# Patient Record
Sex: Female | Born: 1945 | Race: White | Hispanic: No | Marital: Married | Smoking: Never smoker
Health system: Southern US, Community
[De-identification: ages and names within clinical notes are randomized; demographics above are authoritative.]

## PROBLEM LIST (undated history)

## (undated) DIAGNOSIS — J309 Allergic rhinitis, unspecified: Secondary | ICD-10-CM

## (undated) DIAGNOSIS — E039 Hypothyroidism, unspecified: Secondary | ICD-10-CM

## (undated) DIAGNOSIS — I8390 Asymptomatic varicose veins of unspecified lower extremity: Secondary | ICD-10-CM

## (undated) DIAGNOSIS — H269 Unspecified cataract: Secondary | ICD-10-CM

## (undated) DIAGNOSIS — I10 Essential (primary) hypertension: Secondary | ICD-10-CM

## (undated) DIAGNOSIS — M25461 Effusion, right knee: Secondary | ICD-10-CM

## (undated) DIAGNOSIS — E785 Hyperlipidemia, unspecified: Secondary | ICD-10-CM

## (undated) DIAGNOSIS — C801 Malignant (primary) neoplasm, unspecified: Secondary | ICD-10-CM

## (undated) DIAGNOSIS — C44311 Basal cell carcinoma of skin of nose: Secondary | ICD-10-CM

## (undated) DIAGNOSIS — C50919 Malignant neoplasm of unspecified site of unspecified female breast: Secondary | ICD-10-CM

## (undated) DIAGNOSIS — M1990 Unspecified osteoarthritis, unspecified site: Secondary | ICD-10-CM

## (undated) HISTORY — DX: Hypothyroidism, unspecified: E03.9

## (undated) HISTORY — DX: Asymptomatic varicose veins of unspecified lower extremity: I83.90

## (undated) HISTORY — DX: Malignant neoplasm of unspecified site of unspecified female breast: C50.919

## (undated) HISTORY — DX: Effusion, right knee: M25.461

## (undated) HISTORY — PX: TUBAL LIGATION: SHX77

## (undated) HISTORY — DX: Unspecified cataract: H26.9

## (undated) HISTORY — DX: Unspecified osteoarthritis, unspecified site: M19.90

## (undated) HISTORY — DX: Essential (primary) hypertension: I10

## (undated) HISTORY — DX: Allergic rhinitis, unspecified: J30.9

## (undated) HISTORY — DX: Hyperlipidemia, unspecified: E78.5

## (undated) HISTORY — DX: Basal cell carcinoma of skin of nose: C44.311

## (undated) HISTORY — DX: Malignant (primary) neoplasm, unspecified: C80.1

---

## 1982-06-17 HISTORY — PX: TUBAL LIGATION: SHX77

## 1993-06-17 DIAGNOSIS — C50919 Malignant neoplasm of unspecified site of unspecified female breast: Secondary | ICD-10-CM | POA: Insufficient documentation

## 1994-06-17 HISTORY — PX: BREAST LUMPECTOMY: SHX2

## 1998-07-19 ENCOUNTER — Ambulatory Visit (HOSPITAL_COMMUNITY): Admission: RE | Admit: 1998-07-19 | Discharge: 1998-07-19 | Payer: Self-pay | Admitting: Endocrinology

## 1998-07-19 ENCOUNTER — Encounter: Payer: Self-pay | Admitting: Endocrinology

## 1998-11-09 ENCOUNTER — Encounter: Payer: Self-pay | Admitting: Emergency Medicine

## 1998-11-09 ENCOUNTER — Emergency Department (HOSPITAL_COMMUNITY): Admission: EM | Admit: 1998-11-09 | Discharge: 1998-11-09 | Payer: Self-pay | Admitting: Emergency Medicine

## 1999-03-12 ENCOUNTER — Other Ambulatory Visit: Admission: RE | Admit: 1999-03-12 | Discharge: 1999-03-12 | Payer: Self-pay | Admitting: Gynecology

## 1999-04-10 ENCOUNTER — Ambulatory Visit (HOSPITAL_COMMUNITY): Admission: RE | Admit: 1999-04-10 | Discharge: 1999-04-10 | Payer: Self-pay | Admitting: Gynecology

## 1999-04-10 ENCOUNTER — Encounter (INDEPENDENT_AMBULATORY_CARE_PROVIDER_SITE_OTHER): Payer: Self-pay | Admitting: Specialist

## 1999-07-16 ENCOUNTER — Encounter: Payer: Self-pay | Admitting: Endocrinology

## 1999-07-16 ENCOUNTER — Ambulatory Visit (HOSPITAL_COMMUNITY): Admission: RE | Admit: 1999-07-16 | Discharge: 1999-07-16 | Payer: Self-pay | Admitting: Endocrinology

## 2000-03-31 ENCOUNTER — Encounter: Admission: RE | Admit: 2000-03-31 | Discharge: 2000-03-31 | Payer: Self-pay | Admitting: Endocrinology

## 2000-03-31 ENCOUNTER — Encounter: Payer: Self-pay | Admitting: Endocrinology

## 2001-04-13 ENCOUNTER — Other Ambulatory Visit: Admission: RE | Admit: 2001-04-13 | Discharge: 2001-04-13 | Payer: Self-pay | Admitting: Gynecology

## 2001-07-22 ENCOUNTER — Ambulatory Visit (HOSPITAL_COMMUNITY): Admission: RE | Admit: 2001-07-22 | Discharge: 2001-07-22 | Payer: Self-pay | Admitting: Gynecology

## 2001-07-22 ENCOUNTER — Encounter (INDEPENDENT_AMBULATORY_CARE_PROVIDER_SITE_OTHER): Payer: Self-pay | Admitting: Specialist

## 2003-05-09 ENCOUNTER — Ambulatory Visit (HOSPITAL_COMMUNITY): Admission: RE | Admit: 2003-05-09 | Discharge: 2003-05-09 | Payer: Self-pay | Admitting: Endocrinology

## 2003-05-26 ENCOUNTER — Other Ambulatory Visit: Admission: RE | Admit: 2003-05-26 | Discharge: 2003-05-26 | Payer: Self-pay | Admitting: Endocrinology

## 2003-07-19 ENCOUNTER — Other Ambulatory Visit: Admission: RE | Admit: 2003-07-19 | Discharge: 2003-07-19 | Payer: Self-pay | Admitting: Gynecology

## 2004-01-02 ENCOUNTER — Ambulatory Visit (HOSPITAL_COMMUNITY): Admission: RE | Admit: 2004-01-02 | Discharge: 2004-01-02 | Payer: Self-pay | Admitting: Endocrinology

## 2004-09-13 ENCOUNTER — Other Ambulatory Visit: Admission: RE | Admit: 2004-09-13 | Discharge: 2004-09-13 | Payer: Self-pay | Admitting: Gynecology

## 2004-12-05 ENCOUNTER — Ambulatory Visit (HOSPITAL_COMMUNITY): Admission: RE | Admit: 2004-12-05 | Discharge: 2004-12-05 | Payer: Self-pay | Admitting: Endocrinology

## 2005-08-12 ENCOUNTER — Ambulatory Visit (HOSPITAL_COMMUNITY): Admission: RE | Admit: 2005-08-12 | Discharge: 2005-08-12 | Payer: Self-pay | Admitting: Endocrinology

## 2006-06-20 ENCOUNTER — Ambulatory Visit: Payer: Self-pay | Admitting: Oncology

## 2006-07-09 ENCOUNTER — Encounter: Admission: RE | Admit: 2006-07-09 | Discharge: 2006-07-09 | Payer: Self-pay | Admitting: Endocrinology

## 2006-07-28 ENCOUNTER — Ambulatory Visit: Payer: Self-pay | Admitting: Oncology

## 2006-08-13 ENCOUNTER — Ambulatory Visit (HOSPITAL_COMMUNITY): Admission: RE | Admit: 2006-08-13 | Discharge: 2006-08-13 | Payer: Self-pay | Admitting: Endocrinology

## 2007-08-31 ENCOUNTER — Ambulatory Visit (HOSPITAL_COMMUNITY): Admission: RE | Admit: 2007-08-31 | Discharge: 2007-08-31 | Payer: Self-pay | Admitting: Oncology

## 2008-08-23 ENCOUNTER — Ambulatory Visit (HOSPITAL_COMMUNITY): Admission: RE | Admit: 2008-08-23 | Discharge: 2008-08-23 | Payer: Self-pay | Admitting: Internal Medicine

## 2008-12-08 ENCOUNTER — Encounter (INDEPENDENT_AMBULATORY_CARE_PROVIDER_SITE_OTHER): Payer: Self-pay | Admitting: *Deleted

## 2009-07-03 ENCOUNTER — Telehealth: Payer: Self-pay | Admitting: Internal Medicine

## 2009-07-19 ENCOUNTER — Encounter (INDEPENDENT_AMBULATORY_CARE_PROVIDER_SITE_OTHER): Payer: Self-pay | Admitting: *Deleted

## 2009-08-25 ENCOUNTER — Encounter (INDEPENDENT_AMBULATORY_CARE_PROVIDER_SITE_OTHER): Payer: Self-pay | Admitting: *Deleted

## 2009-08-28 ENCOUNTER — Ambulatory Visit: Payer: Self-pay | Admitting: Internal Medicine

## 2009-08-28 ENCOUNTER — Ambulatory Visit (HOSPITAL_COMMUNITY): Admission: RE | Admit: 2009-08-28 | Discharge: 2009-08-28 | Payer: Self-pay | Admitting: Endocrinology

## 2009-09-19 ENCOUNTER — Ambulatory Visit: Payer: Self-pay | Admitting: Internal Medicine

## 2010-03-20 ENCOUNTER — Ambulatory Visit (HOSPITAL_COMMUNITY): Admission: RE | Admit: 2010-03-20 | Discharge: 2010-03-20 | Payer: Self-pay | Admitting: Endocrinology

## 2010-04-18 ENCOUNTER — Encounter: Admission: RE | Admit: 2010-04-18 | Discharge: 2010-04-18 | Payer: Self-pay | Admitting: Endocrinology

## 2010-04-18 ENCOUNTER — Other Ambulatory Visit: Admission: RE | Admit: 2010-04-18 | Discharge: 2010-04-18 | Payer: Self-pay | Admitting: Interventional Radiology

## 2010-07-08 ENCOUNTER — Encounter: Payer: Self-pay | Admitting: Endocrinology

## 2010-07-19 NOTE — Miscellaneous (Signed)
Summary: colon on 09/06/2009/lk  Clinical Lists Changes  Medications: Added new medication of MOVIPREP 100 GM  SOLR (PEG-KCL-NACL-NASULF-NA ASC-C) As directed - Signed Rx of MOVIPREP 100 GM  SOLR (PEG-KCL-NACL-NASULF-NA ASC-C) As directed;  #1 x 0;  Signed;  Entered by: Clide Cliff RN;  Authorized by: Hart Carwin MD;  Method used: Electron[facility] to [facility]  [phone] [address] [place], [place], [place]  [address], Ph: [phone] or [phone], Fax: [phone] Allergies: Added new allergy or adverse reaction of AMOXICILLIN Observations: Added new observation of NKA: F (08/28/2009 9:07)    Prescriptions: MOVIPREP 100 GM  SOLR (PEG-KCL-NACL-NASULF-NA ASC-C) As directed  #1 x 0   Entered by:   Clide Cliff RN   Authorized by:   Hart Carwin MD   Signed by:   Clide Cliff RN on 08/28/2009   Method used:   Electronically to        [facility]  [address] [phone]* (retail)       [address]       [place], [place]  [address]       Ph: [phone] or [phone]       Fax: [phone]   RxID:   [id number]

## 2010-07-19 NOTE — Letter (Signed)
Summary: Recall Colonoscopy Letter  [facility]  [address] [place], [place] [address]   Phone: [phone]  Fax: [phone]      December 08, 2008 MRN: [id number]   [name] [address] [place], [place]  [address]   Dear Ms. Ruis,   According to your medical record, it is time for you to schedule a Colonoscopy. The American Cancer Society recommends this procedure as a method to detect early colon cancer. Patients with a family history of colon cancer, or a personal history of colon polyps or inflammatory bowel disease are at increased risk.  This letter has beeen generated based on the recommendations made at the time of your procedure. If you feel that in your particular situation this may no longer apply, please contact our office.  Please call our office at [phone] to schedule this appointment or to update your records at your earliest convenience.  Thank you for cooperating with [place] to provide you with the very best care possible.   Sincerely,  Hedwig Morton. Juanda Chance, M.D.  [facility] Gastroenterology Division [phone]

## 2010-07-19 NOTE — Procedures (Signed)
Summary: Colonoscopy  Patient: Sandra Galloway Note: All result statuses are Final unless otherwise noted.  Tests: (1) Colonoscopy (COL)   COL Colonoscopy           DONE     [facility]     [address]     [place], [place]  [address]           COLONOSCOPY PROCEDURE REPORT           PATIENT:  Sandra Galloway, Sandra Galloway  MR#:  [id number]     BIRTHDATE:  1946/04/16, 63 yrs. old  GENDER:  female     ENDOSCOPIST:  Hedwig Morton. Juanda Chance, MD     REF. BY:  Adela Lank, M.D.     PROCEDURE DATE:  09/19/2009     PROCEDURE:  Colonoscopy [address]     ASA CLASS:  Class I     INDICATIONS:  Elevated Risk Screening mother with colon cancer age     36     prior colon 1999, 2005, no polyps     MEDICATIONS:   Versed 8 mg, Fentanyl 50 mcg           DESCRIPTION OF PROCEDURE:   After the risks benefits and     alternatives of the procedure were thoroughly explained, informed     consent was obtained.  Digital rectal exam was performed and     revealed no rectal masses.   The LB CF-[id number] endoscope     was introduced through the anus and advanced to the cecum, which     was identified by both the appendix and ileocecal valve, without     limitations.  The quality of the prep was good, using MoviPrep.     The instrument was then slowly withdrawn as the colon was fully     examined.     <<PROCEDUREIMAGES>>           FINDINGS:  Moderate diverticulosis was found in the sigmoid colon     (see image1 and image6).  This was otherwise a normal examination     of the colon (see image7, image5, image4, image3, and image2).     Retroflexed views in the rectum revealed no abnormalities.    The     scope was then withdrawn from the patient and the procedure     completed.           COMPLICATIONS:  None     ENDOSCOPIC IMPRESSION:     1) Moderate diverticulosis in the sigmoid colon     2) Otherwise normal examination     RECOMMENDATIONS:     1) high fiber diet     REPEAT EXAM:  In 5 year(s) for.        ______________________________     Hedwig Morton. Juanda Chance, MD           CC:           n.     eSIGNED:   Hedwig Morton. Velva Molinari at 09/19/2009 01:54 PM           Jake Shark, [id number]  Note: An exclamation mark (!) indicates a result that was not dispersed into the flowsheet. Document Creation Date: 09/19/2009 1:55 PM _______________________________________________________________________  (1) Order result status: Final Collection or observation date-time: 09/19/2009 13:48 Requested date-time:  Receipt date-time:  Reported date-time:  Referring Physician:   Ordering Physician: Lina Sar [id number]) Specimen Source:  Source: Launa Grill Order Number: [id number] Lab site:   Appended Document: Colonoscopy  Clinical Lists Changes  Observations: Added new observation of COLONNXTDUE: 09/2014 (09/19/2009 16:07)

## 2010-07-19 NOTE — Letter (Signed)
Summary: Previsit letter  [facility]  [address] [place], [place] [address]   Phone: [phone]  Fax: [phone]       07/19/2009 MRN: [id number]  JAELEAH SMYSER [address] [place], [place]  [address]  [place]  Dear Ms. Daphine Deutscher,  Welcome to the Gastroenterology Division at [facility].    You are scheduled to see a nurse for your pre-procedure visit on 08/28/2009 at 9:00am on the 3rd floor at [facility], [address]  We ask that you try to arrive at our office 15 minutes prior to your appointment time to allow for check-in.  Your nurse visit will consist of discussing your medical and surgical history, your immediate family medical history, and your medications.    Please bring a complete list of all your medications or, if you prefer, bring the medication bottles and we will list them.  We will need to be aware of both prescribed and over the counter drugs.  We will need to know exact dosage information as well.  If you are on blood thinners (Coumadin, Plavix, Aggrenox, Ticlid, etc.) please call our office today/prior to your appointment, as we need to consult with your physician about holding your medication.   Please be prepared to read and sign documents such as consent forms, a financial agreement, and acknowledgement forms.  If necessary, and with your consent, a friend or relative is welcome to sit-in on the nurse visit with you.  Please bring your insurance card so that we may make a copy of it.  If your insurance requires a referral to see a specialist, please bring your referral form from your primary care physician.  No co-pay is required for this nurse visit.     If you cannot keep your appointment, please call [phone] to cancel or reschedule prior to your appointment date.  This allows [place] the opportunity to schedule an appointment for another patient in need of care.    Thank you for choosing [facility] for your medical  needs.  We appreciate the opportunity to care for you.  Please visit [place] at our website  to learn more about our practice.                     Sincerely.                                                                                                                   The Gastroenterology Division

## 2010-07-19 NOTE — Letter (Signed)
Summary: [facility] Instructions  [facility]  [address] [place], [place] [address]   Phone: [phone]  Fax: [phone]       SAHARAH SHERROW    1945-10-25    MRN: [id number]       Procedure Day /Date:  Tuesday  09/19/09     Arrival Time:  12:30pm     Procedure Time: 1:30pm     Location of Procedure:                    _ x_  [facility] (4th Floor)   PREPARATION FOR COLONOSCOPY WITH MIRALAX  Starting 5 days prior to your procedure Friday 09/14/09 do not eat nuts, seeds, popcorn, corn, beans, peas,  salads, or any raw vegetables.  Do not take any fiber supplements (e.g. Metamucil, Citrucel, and Benefiber). ____________________________________________________________________________________________________   THE DAY BEFORE YOUR PROCEDURE         DATE: Monday   09/18/09  1   Drink clear liquids the entire day-NO SOLID FOOD  2   Do not drink anything colored red or purple.  Avoid juices with pulp.  No orange juice.  3   Drink at least 64 oz. (8 glasses) of fluid/clear liquids during the day to prevent dehydration and help the prep work efficiently.  CLEAR LIQUIDS INCLUDE: Water Jello Ice Popsicles Tea (sugar ok, no milk/cream) Powdered fruit flavored drinks Coffee (sugar ok, no milk/cream) Gatorade Juice: apple, white grape, white cranberry  Lemonade Clear bullion, consomm, broth Carbonated beverages (any kind) Strained chicken noodle soup Hard Candy  4   Mix the entire bottle of Miralax with 64 oz. of Gatorade/Powerade in the morning and put in the refrigerator to chill.  5   At 3:00 pm take 2 Dulcolax/Bisacodyl tablets.  6   At 4:30 pm take one Reglan/Metoclopramide tablet.  7  Starting at 5:00 pm drink one 8 oz glass of the Miralax mixture every 15-20 minutes until you have finished drinking the entire 64 oz.  You should finish drinking prep around 7:30 or 8:00 pm.  8   If you are nauseated, you may take the 2nd Reglan/Metoclopramide  tablet at 6:30 pm.        9    At 8:00 pm take 2 more DULCOLAX/Bisacodyl tablets.     THE DAY OF YOUR PROCEDURE      DATE:  Tuesday   09/19/09  You may drink clear liquids until  11:30am  (2 HOURS BEFORE PROCEDURE).   MEDICATION INSTRUCTIONS  Unless otherwise instructed, you should take regular prescription medications with a small sip of water as early as possible the morning of your procedure.         OTHER INSTRUCTIONS  You will need a responsible adult at least 64 years of age to accompany you and drive you home.   This person must remain in the waiting room during your procedure.  Wear loose fitting clothing that is easily removed.  Leave jewelry and other valuables at home.  However, you may wish to bring a book to read or an iPod/MP3 player to listen to music as you wait for your procedure to start.  Remove all body piercing jewelry and leave at home.  Total time from sign-in until discharge is approximately 2-3 hours.  You should go home directly after your procedure and rest.  You can resume normal activities the day after your procedure.  The day of your procedure you should not:   Drive  Make legal decisions   Operate machinery   Drink alcohol   Return to work  You will receive specific instructions about eating, activities and medications before you leave.   The above instructions have been reviewed and explained to me by   Clide Cliff, RN______________________    I fully understand and can verbalize these instructions _____________________________ Date _______

## 2010-07-19 NOTE — Progress Notes (Signed)
Summary: Schedule Colonoscopy  Phone Note Outgoing Call Call back at [facility] Phone [phone]   Call placed by: Harlow Mares CMA Duncan Dull),  July 03, 2009 11:08 AM Call placed to: Patient Summary of Call: Left message on patients machine to call back. patient needs colonoscopy Initial call taken by: Harlow Mares CMA Duncan Dull),  July 03, 2009 11:09 AM  Follow-up for Phone Call        No Answer  Follow-up by: Harlow Mares CMA Duncan Dull),  July 10, 2009 4:28 PM  Additional Follow-up for Phone Call Additional follow up Details #1::        patient scheduled for 09/06/2009. Additional Follow-up by: Harlow Mares CMA (AAMA),  July 19, 2009 11:54 AM

## 2010-09-19 ENCOUNTER — Other Ambulatory Visit (HOSPITAL_COMMUNITY): Payer: Self-pay | Admitting: Endocrinology

## 2010-09-19 DIAGNOSIS — E042 Nontoxic multinodular goiter: Secondary | ICD-10-CM

## 2010-11-02 NOTE — Op Note (Signed)
[facility]  Patient:    Sandra Galloway, Sandra Galloway Visit Number: [id number] MRN: [id number]          Service Type: DSU Location: [facility] Attending Physician:  Susa Raring Dictated by:   Luvenia Redden, M.D. Proc. Date: 07/22/01 Admit Date:  07/22/2001                             Operative Report  PREOPERATIVE DIAGNOSIS:       Postmenopausal bleeding.  POSTOPERATIVE DIAGNOSES:      1. Postmenopausal bleeding.                               2. Endometrial polyps.  OPERATION:                    Hysteroscopy and dilatation and curettage.  SURGEON:                      Luvenia Redden, M.D.  DESCRIPTION OF PROCEDURE:     Under sedation, the patient was prepped and draped in a sterile manner.  The bladder was catheterized.  The speculum was inserted into the vagina and the cervix grasped with the tenaculum.  A paracervical block was done by injecting 10 cc of lidocaine at the 4 and 8 positions paracervically.  The uterus then sounded to 9 cm and the cervix was dilated.  The hysteroscope was placed into the endocervical canal.  Using sorbitol as the distending medium, hysteroscopy was performed.  At the upper end of the endocervical canal, immediately a polypoid structure was visualized, was rather large, and pretty much obscured the view as the scope was advanced in the endometrial cavity.  This was felt to be due to multiple large intracavitary polyps.  The scope was retracted, and the endometrial cavity was then explored with polyp forceps, and a large amount of polypoid tissue was then extracted.  This required multiple explorations and bites with the polyp forceps.  Eventually, the cavity became clear and clean, and exploration of the cavity revealed no more polypoid material could be grasped. The endocervical curettage was done and this was sent as a separate specimen. Endometrial cavity curettage was done and this was sent with some  polypoid fragments.  The scope was then repositioned and the intracavitary portion of the uterus was then viewed.  Other than the very shaggy appearance to the endometrial cavity, no further polyps could be identified.  The scope was retracted.  The cavity was wiped with a dry sponge.  No additional tissue was obtained.  The procedure was then terminated.  The fluid deficit was 10 ml.  Blood loss was 25 ml or less.  The patient tolerated the procedure well and was removed to recovery in good condition. Dictated by:   Luvenia Redden, M.D. Attending Physician:  Susa Raring DD:  07/22/01 TD:  07/23/01 Job: [id number]

## 2011-03-11 ENCOUNTER — Other Ambulatory Visit (HOSPITAL_COMMUNITY): Payer: Self-pay

## 2011-03-20 ENCOUNTER — Ambulatory Visit (HOSPITAL_COMMUNITY)
Admission: RE | Admit: 2011-03-20 | Discharge: 2011-03-20 | Disposition: A | Discharge status: Home / Self Care | Payer: 59 | Source: Ambulatory Visit | Attending: Endocrinology | Admitting: Endocrinology

## 2011-03-20 DIAGNOSIS — E042 Nontoxic multinodular goiter: Secondary | ICD-10-CM

## 2012-06-29 ENCOUNTER — Other Ambulatory Visit (HOSPITAL_COMMUNITY): Payer: Self-pay | Admitting: Endocrinology

## 2012-06-29 DIAGNOSIS — E041 Nontoxic single thyroid nodule: Secondary | ICD-10-CM

## 2012-07-02 ENCOUNTER — Ambulatory Visit (HOSPITAL_COMMUNITY)
Admission: RE | Admit: 2012-07-02 | Discharge: 2012-07-02 | Disposition: A | Discharge status: Home / Self Care | Payer: Medicare Other | Source: Ambulatory Visit | Attending: Endocrinology | Admitting: Endocrinology

## 2012-07-02 DIAGNOSIS — E042 Nontoxic multinodular goiter: Secondary | ICD-10-CM | POA: Insufficient documentation

## 2012-07-02 DIAGNOSIS — E041 Nontoxic single thyroid nodule: Secondary | ICD-10-CM

## 2012-07-10 ENCOUNTER — Other Ambulatory Visit: Payer: Self-pay | Admitting: Endocrinology

## 2012-07-10 DIAGNOSIS — E041 Nontoxic single thyroid nodule: Secondary | ICD-10-CM

## 2012-07-21 ENCOUNTER — Other Ambulatory Visit (HOSPITAL_COMMUNITY)
Admission: RE | Admit: 2012-07-21 | Discharge: 2012-07-21 | Disposition: A | Discharge status: Home / Self Care | Payer: Medicare Other | Source: Ambulatory Visit | Attending: Diagnostic Radiology | Admitting: Diagnostic Radiology

## 2012-07-21 ENCOUNTER — Ambulatory Visit
Admission: RE | Admit: 2012-07-21 | Discharge: 2012-07-21 | Disposition: A | Discharge status: Home / Self Care | Payer: Medicare Other | Source: Ambulatory Visit | Attending: Endocrinology | Admitting: Endocrinology

## 2012-07-21 DIAGNOSIS — E049 Nontoxic goiter, unspecified: Secondary | ICD-10-CM | POA: Insufficient documentation

## 2012-07-21 DIAGNOSIS — E041 Nontoxic single thyroid nodule: Secondary | ICD-10-CM

## 2012-08-06 ENCOUNTER — Encounter (INDEPENDENT_AMBULATORY_CARE_PROVIDER_SITE_OTHER): Payer: Self-pay | Admitting: Surgery

## 2012-08-10 ENCOUNTER — Ambulatory Visit (INDEPENDENT_AMBULATORY_CARE_PROVIDER_SITE_OTHER): Payer: Medicare Other | Admitting: Surgery

## 2012-08-10 ENCOUNTER — Encounter (INDEPENDENT_AMBULATORY_CARE_PROVIDER_SITE_OTHER): Payer: Self-pay | Admitting: Surgery

## 2012-08-10 VITALS — BP 136/96 | HR 76 | Temp 98.0°F | Resp 14 | Ht 64.0 in | Wt 164.4 lb

## 2012-08-10 DIAGNOSIS — E041 Nontoxic single thyroid nodule: Secondary | ICD-10-CM

## 2012-08-10 NOTE — Progress Notes (Signed)
General Surgery [facility]  Chief Complaint  Patient presents with  . Thyroid Nodule    Evaluate thyroid nodule - referral from Dr. Juleen China, [facility]    HISTORY: Patient is a 67 year old white female followed for well over 10 years with thyroid nodules. She has a dominant nodule in the thyroid isthmus which has ranged in size from 2.7-3.3 cm by ultrasound. She underwent a followup ultrasound in January 2014 which measured the nodule at 2.9 cm, represented a small increase in size compared to her study in 2008. 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 There are small bilateral nodules with the largest on the left measuring 1.3 cm in the largest on the right measuring 6 mm. Patient has been on thyroid hormone suppression for many years. She currently takes Synthroid 137 mcg daily. She is followed closely by her endocrinologist.  The patient denies any compressive symptoms. She has had no prior neck surgery. She denies any family history of thyroid disease. There is no history of other endocrinopathy.  Past Medical History  Diagnosis Date  . Cancer     right breast     Current Outpatient Prescriptions  Medication Sig Dispense Refill  . levothyroxine (SYNTHROID, LEVOTHROID) 137 MCG tablet Take 137 mcg by mouth daily.      . pravastatin (PRAVACHOL) 80 MG tablet Take 80 mg by mouth daily.       No current facility-administered medications for this visit.     Allergies  Allergen Reactions  . Amoxicillin     REACTION: rash     Family History  Problem Relation Age of Onset  . Cancer Sister     breast     History   Social History  . Marital Status: Married    Spouse Name: N/A    Number of Children: N/A  . Years of Education: N/A   Social History Main Topics  . Smoking status: Never Smoker   . Smokeless tobacco: None  . Alcohol Use: No  . Drug Use: No  . Sexually Active:  None   Other Topics Concern  . None   Social History Narrative  . None     REVIEW OF SYSTEMS - PERTINENT POSITIVES ONLY: No compressive symptoms. Denies tremor. Denies palpitations. Denies new neck masses.  EXAM: Filed Vitals:   08/10/12 0901  BP: 136/96  Pulse: 76  Temp: 98 F (36.7 C)  Resp: 14    HEENT: normocephalic; pupils equal and reactive; sclerae clear; dentition good; mucous membranes moist NECK:  Palpable nodule in the right anterior isthmus, soft, mobile, nontender, approximately 3 cm in size; asymmetric on extension; no palpable anterior or posterior cervical lymphadenopathy; no supraclavicular masses; no tenderness CHEST: clear to auscultation bilaterally without rales, rhonchi, or wheezes CARDIAC: regular rate and rhythm without significant murmur; peripheral pulses are full EXT:  non-tender without edema; no deformity NEURO: no gross focal deficits; no sign of tremor   LABORATORY RESULTS: See [facility] (CHL-Epic) for most recent results   RADIOLOGY RESULTS: See [facility] (CHL-Epic) for most recent results   IMPRESSION: Multinodular thyroid, dominant nodule in the isthmus, 2.9 cm, clinically stable  PLAN: The patient and I had a lengthy discussion regarding these findings. Over the past several years this lesion has waxed and waned in size, likely due to thyroid hormone suppression. It has not significantly enlarged. She has had no compressive symptoms. There are no worrisome findings on multiple percutaneous biopsies.  I provided the patient with written literature to review. We discussed thyroid surgery. At this point the patient would like to continue observation. I will last her to return for physical examination in one year. We will plan to repeat her thyroid ultrasound in 2 years. I would recommend that she not undergo any further percutaneous biopsies of the dominant nodule. Certainly if there was a significant change in size or  character, we would recommend proceeding with thyroidectomy.  Patient understands and will return in 1 year for follow-up.  Velora Heckler, MD, FACS General & Endocrine Surgery [facility]   Visit Diagnoses: 1. Thyroid nodule, uninodular

## 2012-08-10 NOTE — Patient Instructions (Signed)
Thyroid Diseases Your thyroid is a butterfly-shaped gland in your neck. It is located just above your collarbone. It is one of your endocrine glands, which make hormones. The thyroid helps set your metabolism. Metabolism is how your body gets energy from the foods you eat.  Millions of people have thyroid diseases. Women experience thyroid problems more often than men. In fact, overactive thyroid problems (hyperthyroidism) occur in 1% of all women. If you have a thyroid disease, your body may use energy more slowly or quickly than it should.  Thyroid problems also include an immune disease where your body reacts against your thyroid gland (called thyroiditis). A different problem involves lumps and bumps (called nodules) that develop in the gland. The nodules are usually, but not always, noncancerous. THE MOST COMMON THYROID PROBLEMS AND CAUSES ARE DISCUSSED BELOW There are many causes for thyroid problems. Treatment depends upon the exact diagnosis and includes trying to reset your body's metabolism to a normal rate. Hyperthyroidism Too much thyroid hormone from an overactive thyroid gland is called hyperthyroidism. In hyperthyroidism, the body's metabolism speeds up. One of the most frequent forms of hyperthyroidism is known as Graves' disease. Graves' disease tends to run in families. Although Graves' is thought to be caused by a problem with the immune system, the exact nature of the genetic problem is unknown. Hypothyroidism Too little thyroid hormone from an underactive thyroid gland is called hypothyroidism. In hypothyroidism, the body's metabolism is slowed. Several things can cause this condition. Most causes affect the thyroid gland directly and hurt its ability to make enough hormone.  Rarely, there may be a pituitary gland tumor (located near the base of the brain). The tumor can block the pituitary from producing thyroid-stimulating hormone (TSH). Your body makes TSH to stimulate the thyroid  to work properly. If the pituitary does not make enough TSH, the thyroid fails to make enough hormones needed for good health. Whether the problem is caused by thyroid conditions or by the pituitary gland, the result is that the thyroid is not making enough hormones. Hypothyroidism causes many physical and mental processes to become sluggish. The body consumes less oxygen and produces less body heat. Thyroid Nodules A thyroid nodule is a small swelling or lump in the thyroid gland. They are common. These nodules represent either a growth of thyroid tissue or a fluid-filled cyst. Both form a lump in the thyroid gland. Almost half of all people will have tiny thyroid nodules at some point in their lives. Typically, these are not noticeable until they become large and affect normal thyroid size. Larger nodules that are greater than a half inch across (about 1 centimeter) occur in about 5 percent of people. Although most nodules are not cancerous, people who have them should seek medical care to rule out cancer. Also, some thyroid nodules may produce too much thyroid hormone or become too large. Large nodules or a large gland can interfere with breathing or swallowing or may cause neck discomfort. Other problems Other thyroid problems include cancer and thyroiditis. Thyroiditis is a malfunction of the body's immune system. Normally, the immune system works to defend the body against infection and other problems. When the immune system is not working properly, it may mistakenly attack normal cells, tissues, and organs. Examples of autoimmune diseases are Hashimoto's thyroiditis (which causes low thyroid function) and Graves' disease (which causes excess thyroid function). SYMPTOMS  Symptoms vary greatly depending upon the exact type of problem with the thyroid. Hyperthyroidism-is when your thyroid is too   active and makes more thyroid hormone than your body needs. The most common cause is Graves' Disease. Too  much thyroid hormone can cause some or all of the following symptoms:  Anxiety.  Irritability.  Difficulty sleeping.  Fatigue.  A rapid or irregular heartbeat.  A fine tremor of your hands or fingers.  An increase in perspiration.  Sensitivity to heat.  Weight loss, despite normal food intake.  Brittle hair.  Enlargement of your thyroid gland (goiter).  Light menstrual periods.  Frequent bowel movements. Graves' disease can specifically cause eye and skin problems. The skin problems involve reddening and swelling of the skin, often on your shins and on the top of your feet. Eye problems can include the following:  Excess tearing and sensation of grit or sand in either or both eyes.  Reddened or inflamed eyes.  Widening of the space between your eyelids.  Swelling of the lids and tissues around the eyes.  Light sensitivity.  Ulcers on the cornea.  Double vision.  Limited eye movements.  Blurred or reduced vision. Hypothyroidism- is when your thyroid gland is not active enough. This is more common than hyperthyroidism. Symptoms can vary a lot depending of the severity of the hormone deficiency. Symptoms may develop over a long period of time and can include several of the following:  Fatigue.  Sluggishness.  Increased sensitivity to cold.  Constipation.  Pale, dry skin.  A puffy face.  Hoarse voice.  High blood cholesterol level.  Unexplained weight gain.  Muscle aches, tenderness and stiffness.  Pain, stiffness or swelling in your joints.  Muscle weakness.  Heavier than normal menstrual periods.  Brittle fingernails and hair.  Depression. Thyroid Nodules - most do not cause signs or symptoms. Occasionally, some may become so large that you can feel or even see the swelling at the base of your neck. You may realize a lump or swelling is there when you are shaving or putting on makeup. Men might become aware of a nodule when shirt collars  suddenly feel too tight. Some nodules produce too much thyroid hormone. This can produce the same symptoms as hyperthyroidism (see above). Thyroid nodules are seldom cancerous. However, a nodule is more likely to be malignant (cancerous) if it:  Grows quickly or feels hard.  Causes you to become hoarse or to have trouble swallowing or breathing.  Causes enlarged lymph nodes under your jaw or in your neck. DIAGNOSIS  Because there are so many possible thyroid conditions, your caregiver may ask for a number of tests. They will do this in order to narrow down the exact diagnosis. These tests can include:  Blood and antibody tests.  Special thyroid scans using small, safe amounts of radioactive iodine.  Ultrasound of the thyroid gland (particularly if there is a nodule or lump).  Biopsy. This is usually done with a special needle. A needle biopsy is a procedure to obtain a sample of cells from the thyroid. The tissue will be tested in a lab and examined under a microscope. TREATMENT  Treatment depends on the exact diagnosis. Hyperthyroidism  Beta-blockers help relieve many of the symptoms.  Anti-thyroid medications prevent the thyroid from making excess hormones.  Radioactive iodine treatment can destroy overactive thyroid cells. The iodine can permanently decrease the amount of hormone produced.  Surgery to remove the thyroid gland.  Treatments for eye problems that come from Graves' disease also include medications and special eye surgery, if felt to be appropriate. Hypothyroidism Thyroid replacement with levothyroxine is   the mainstay of treatment. Treatment with thyroid replacement is usually lifelong and will require monitoring and adjustment from time to time. Thyroid Nodules  Watchful waiting. If a small nodule causes no symptoms or signs of cancer on biopsy, then no treatment may be chosen at first. Re-exam and re-checking blood tests would be the recommended  follow-up.  Anti-thyroid medications or radioactive iodine treatment may be recommended if the nodules produce too much thyroid hormone (see Treatment for Hyperthyroidism above).  Alcohol ablation. Injections of small amounts of ethyl alcohol (ethanol) can cause a non-cancerous nodule to shrink in size.  Surgery (see Treatment for Hyperthyroidism above). HOME CARE INSTRUCTIONS   Take medications as instructed.  Follow through on recommended testing. SEEK MEDICAL CARE IF:   You feel that you are developing symptoms of Hyperthyroidism or Hypothyroidism as described above.  You develop a new lump/nodule in the neck/thyroid area that you had not noticed before.  You feel that you are having side effects from medicines prescribed.  You develop trouble breathing or swallowing. SEEK IMMEDIATE MEDICAL CARE IF:   You develop a fever of 102 F (38.9 C) or higher.  You develop severe sweating.  You develop palpitations and/or rapid heart beat.  You develop shortness of breath.  You develop nausea and vomiting.  You develop extreme shakiness.  You develop agitation.  You develop lightheadedness or have a fainting episode. Document Released: 03/31/2007 Document Revised: 08/26/2011 Document Reviewed: 03/31/2007 [employer] Patient Information 2013 [employer].  

## 2012-09-28 ENCOUNTER — Encounter (INDEPENDENT_AMBULATORY_CARE_PROVIDER_SITE_OTHER): Payer: Self-pay

## 2012-11-11 ENCOUNTER — Other Ambulatory Visit: Payer: Self-pay | Admitting: *Deleted

## 2012-11-11 DIAGNOSIS — I83893 Varicose veins of bilateral lower extremities with other complications: Secondary | ICD-10-CM

## 2012-12-29 ENCOUNTER — Encounter: Payer: Medicare Other | Admitting: Vascular Surgery

## 2013-01-28 ENCOUNTER — Encounter: Payer: Self-pay | Admitting: Vascular Surgery

## 2013-01-29 ENCOUNTER — Encounter (INDEPENDENT_AMBULATORY_CARE_PROVIDER_SITE_OTHER): Payer: Medicare Other | Admitting: *Deleted

## 2013-01-29 ENCOUNTER — Encounter: Payer: Self-pay | Admitting: Vascular Surgery

## 2013-01-29 ENCOUNTER — Ambulatory Visit (INDEPENDENT_AMBULATORY_CARE_PROVIDER_SITE_OTHER): Payer: Medicare Other | Admitting: Vascular Surgery

## 2013-01-29 VITALS — BP 153/86 | HR 67 | Ht 64.0 in | Wt 160.9 lb

## 2013-01-29 DIAGNOSIS — I872 Venous insufficiency (chronic) (peripheral): Secondary | ICD-10-CM

## 2013-01-29 DIAGNOSIS — I83893 Varicose veins of bilateral lower extremities with other complications: Secondary | ICD-10-CM | POA: Insufficient documentation

## 2013-01-29 NOTE — Progress Notes (Signed)
[facility]  Referred by:  Darci Needle, MD [address] [place], [place] [address]  Reason for referral: B leg varicosities  History of Present Illness  Sandra Galloway is a 67 y.o. (March 20, 1946) female who presents with chief complaint: B leg varicosities.  Patient notes, onset of spider veins during her pregnancy years ago, but over last 6 months the patient notes increasing vein growth, associated with no obvious trigger.  The patient's symptoms include: fullness and heaviness in both legs.  The patient has had never history of DVT, known history of pregnancy, known history of varicose vein, no history of venous stasis ulcers, no history of  Lymphedema and known history of skin changes in lower legs.  There is known family history of venous disorders.  The patient has never used compression stockings in the past.  Past Medical History  Diagnosis Date  . Cancer     right breast    Past Surgical History  Procedure Laterality Date  . Breast lumpectomy  1996    right breast    History   Social History  . Marital Status: Married    Spouse Name: N/A    Number of Children: N/A  . Years of Education: N/A   Occupational History  . Not on file.   Social History Main Topics  . Smoking status: Never Smoker   . Smokeless tobacco: Never Used  . Alcohol Use: No  . Drug Use: No  . Sexual Activity: Not on file   Other Topics Concern  . Not on file   Social History Narrative  . No narrative on file    Family History  Problem Relation Age of Onset  . Cancer Sister     breast  . Cancer Mother   . Other Mother     varicose veins  . Heart disease Father   . Hyperlipidemia Father   . Heart attack Father     Current Outpatient Prescriptions on File Prior to Visit  Medication Sig Dispense Refill  . levothyroxine (SYNTHROID, LEVOTHROID) 137 MCG tablet Take 137 mcg by mouth daily.      . pravastatin (PRAVACHOL) 80 MG tablet Take 80 mg  by mouth daily.       No current facility-administered medications on file prior to visit.    Allergies  Allergen Reactions  . Amoxicillin     REACTION: rash    REVIEW OF SYSTEMS:  (Positives checked otherwise negative)  CARDIOVASCULAR:  []  chest pain, []  chest pressure, []  palpitations, []  shortness of breath when laying flat, []  shortness of breath with exertion,  []  pain in feet when walking, []  pain in feet when laying flat, []  history of blood clot in veins (DVT), []  history of phlebitis, []  swelling in legs, [x]  varicose veins  PULMONARY:  []  productive cough, []  asthma, []  wheezing  NEUROLOGIC:  []  weakness in arms or legs, []  numbness in arms or legs, []  difficulty speaking or slurred speech, []  temporary loss of vision in one eye, []  dizziness  HEMATOLOGIC:  []  bleeding problems, []  problems with blood clotting too easily  MUSCULOSKEL:  []  joint pain, []  joint swelling  GASTROINTEST:  []  vomiting blood, []  blood in stool     GENITOURINARY:  []  burning with urination, []  blood in urine  PSYCHIATRIC:  []  history of major depression  INTEGUMENTARY:  []  rashes, []  ulcers  CONSTITUTIONAL:  []  fever, []  chills  Physical Examination Filed Vitals:   01/29/13 0920  BP: 153/86  Pulse: 67  Height: 5\' 4"  (1.626 m)  Weight: 160 lb 14.4 oz (72.984 kg)  SpO2: 100%   Body mass index is 27.6 kg/(m^2).  General: A&O x 3, WDWN  Head: /AT  Ear/Nose/Throat: Hearing grossly intact, nares w/o erythema or drainage, oropharynx w/o Erythema/Exudate  Eyes: PERRLA, EOMI  Neck: Supple, no nuchal rigidity, no palpable LAD  Pulmonary: Sym exp, good air movt, CTAB, no rales, rhonchi, & wheezing  Cardiac: RRR, Nl S1, S2, no Murmurs, rubs or gallops  Vascular: Vessel Right Left  Radial Palpable Palpable  Brachial  Palpable  Palpable  Carotid Palpable, without bruit Palpable, without bruit  Aorta Not palpable N/A  Femoral Palpable Palpable  Popliteal Not palpable Not palpable   PT Faintly Palpable Faintly Palpable  DP Palpable Palpable   Gastrointestinal: soft, NTND, -G/R, - HSM, - masses, - CVAT B  Musculoskeletal: M/S 5/5 throughout , Extremities without ischemic changes , BLE with extensive spider veins, +mild LDS L>R, clusters of varicosities bilaterally  Neurologic: CN 2-12 intact , Pain and light touch intact in extremities , Motor exam as listed above  Psychiatric: Judgment intact, Mood & affect appropriate for pt's clinical situation  Dermatologic: See M/S exam for extremity exam, no rashes otherwise noted  Lymph : No Cervical, Axillary, or Inguinal lymphadenopathy   Non-Invasive Vascular Imaging  BLE Venous Insufficiency Duplex (Date: 01/29/2013):   RLE: no DVT and SVT, + GSV reflux, + deep venous reflux  LLE: no DVT and SVT, + GSV reflux, + deep venous reflux  Outside Studies/Documentation 4 pages of outside documents were reviewed including: outpatient clinic and labs.  Medical Decision Making  Kelce Bouton Daphine Deutscher is a 67 y.o. female who presents with: BLE chronic venous insufficiency (C4).   Based on the patient's history and examination, I recommend: compressive therapy for 3 months followe by evaluation for EVLA B GSV.  I discussed with the patient the use of her 20-30 mm thigh high compression stockings and need for 3 month trial of such.  The patient will follow up in 3 months with my partners in the [facility] for evaluation for: EVLA B GSV.  I discussed with the patient: from a cosmetic viewpoint, I suspect additional stab phlebectomy and sclerotherapy will needed after EVLA.  Thank you for allowing [place] to participate in this patient's care.  Leonides Sake, MD [facility] Office: [phone] Pager: [phone]  01/29/2013, 9:45 AM

## 2013-02-01 ENCOUNTER — Encounter: Payer: Self-pay | Admitting: Endocrinology

## 2013-04-22 ENCOUNTER — Other Ambulatory Visit: Payer: Self-pay

## 2013-05-04 ENCOUNTER — Ambulatory Visit: Payer: Medicare Other | Admitting: Vascular Surgery

## 2013-05-05 ENCOUNTER — Other Ambulatory Visit (HOSPITAL_COMMUNITY): Payer: Self-pay | Admitting: Endocrinology

## 2013-05-05 DIAGNOSIS — E041 Nontoxic single thyroid nodule: Secondary | ICD-10-CM

## 2013-05-07 ENCOUNTER — Ambulatory Visit (HOSPITAL_COMMUNITY): Payer: Medicare Other

## 2013-05-10 ENCOUNTER — Encounter: Payer: Self-pay | Admitting: Vascular Surgery

## 2013-05-11 ENCOUNTER — Ambulatory Visit (INDEPENDENT_AMBULATORY_CARE_PROVIDER_SITE_OTHER): Payer: Medicare Other | Admitting: Vascular Surgery

## 2013-05-11 ENCOUNTER — Encounter: Payer: Self-pay | Admitting: Vascular Surgery

## 2013-05-11 VITALS — BP 125/79 | HR 69 | Resp 16 | Ht 64.0 in | Wt 154.0 lb

## 2013-05-11 DIAGNOSIS — I83893 Varicose veins of bilateral lower extremities with other complications: Secondary | ICD-10-CM

## 2013-05-11 NOTE — Progress Notes (Signed)
Here today for continued followup and discussion of her lower surety venous hypertension. She has had had no response from her elevation thigh graduated compression and ibuprofen for discomfort. She is quite active and central prolonged period of time reports a very achy sensation in both legs most specifically from her knees distally. She also has some discomfort there were some very large telangiectasia most prominently over her pretibial region on the left and also a lateral thigh and calf on the right.  Past Medical History  Diagnosis Date  . Cancer     right breast  . Varicose veins     History  Substance Use Topics  . Smoking status: Never Smoker   . Smokeless tobacco: Never Used  . Alcohol Use: No    Family History  Problem Relation Age of Onset  . Cancer Sister     breast  . Cancer Mother   . Other Mother     varicose veins  . Heart disease Father   . Hyperlipidemia Father   . Heart attack Father     Allergies  Allergen Reactions  . Amoxicillin     REACTION: rash    Current outpatient prescriptions:levothyroxine (SYNTHROID, LEVOTHROID) 137 MCG tablet, Take 137 mcg by mouth daily., Disp: , Rfl: ;  pravastatin (PRAVACHOL) 80 MG tablet, Take 80 mg by mouth daily., Disp: , Rfl:   BP 125/79  Pulse 69  Resp 16  Ht 5\' 4"  (1.626 m)  Wt 154 lb (69.854 kg)  BMI 26.42 kg/m2  Body mass index is 26.42 kg/(m^2).       On physical exam well-developed well-nourished white female no acute distress Pulse status 2+ dorsalis pedis pulses bilaterally Marked telangiectasia bilaterally extending into her ankle with no open ulceration. Neurologically she is grossly intact  I did review her prior venous duplex and also reimage her saphenous veins with SonoSite in our office today. This shows enlarged and refluxing great saphenous vein bilaterally.  Impression and plan: Failed conservative treatment of bilateral venous hypertension. Have recommended laser ablation of her great  saphenous vein bilaterally for symptom relief. I explained this would be a stage procedure under local anesthesia. We also recommend sclerotherapy injection to the painful telangiectasia as well. She understands his under local anesthesia in our office. She is requesting oral anti-anxiety treatment at the time of the procedure. We will proceed this in his convenience for her schedule

## 2013-05-25 ENCOUNTER — Other Ambulatory Visit: Payer: Self-pay | Admitting: *Deleted

## 2013-05-25 ENCOUNTER — Telehealth: Payer: Self-pay | Admitting: Vascular Surgery

## 2013-05-25 DIAGNOSIS — I83893 Varicose veins of bilateral lower extremities with other complications: Secondary | ICD-10-CM

## 2013-05-25 DIAGNOSIS — M79609 Pain in unspecified limb: Secondary | ICD-10-CM

## 2013-05-25 NOTE — Telephone Encounter (Addendum)
Message copied by Fredrich Birks on Tue May 25, 2013  3:58 PM ------      Message from: Domenic Moras D      Created: Tue May 25, 2013  2:48 PM      Regarding: scheduling        Please schedule Mrs. Woerner for:            1.  Post laser ablation duplex (left leg, order in EPIC) and VV FU with Dr. Arbie Cookey on 07-15-2013.            2.  Post laser ablation duplex (right leg, order in EPIC) and VV FU with Dr. Arbie Cookey on 07-29-2013.              Thanks! ------  05/25/13: sent msg to sonya, dpm

## 2013-06-29 ENCOUNTER — Other Ambulatory Visit: Payer: Self-pay | Admitting: *Deleted

## 2013-06-29 DIAGNOSIS — I83893 Varicose veins of bilateral lower extremities with other complications: Secondary | ICD-10-CM

## 2013-06-30 ENCOUNTER — Other Ambulatory Visit: Payer: Self-pay | Admitting: *Deleted

## 2013-06-30 DIAGNOSIS — I83893 Varicose veins of bilateral lower extremities with other complications: Secondary | ICD-10-CM

## 2013-07-05 ENCOUNTER — Encounter (INDEPENDENT_AMBULATORY_CARE_PROVIDER_SITE_OTHER): Payer: Self-pay | Admitting: Surgery

## 2013-07-08 ENCOUNTER — Other Ambulatory Visit: Payer: Medicare Other | Admitting: Vascular Surgery

## 2013-07-14 ENCOUNTER — Other Ambulatory Visit: Payer: Self-pay | Admitting: *Deleted

## 2013-07-14 DIAGNOSIS — M79609 Pain in unspecified limb: Secondary | ICD-10-CM

## 2013-07-14 DIAGNOSIS — I83893 Varicose veins of bilateral lower extremities with other complications: Secondary | ICD-10-CM

## 2013-07-14 DIAGNOSIS — F411 Generalized anxiety disorder: Secondary | ICD-10-CM

## 2013-07-14 MED ORDER — LORAZEPAM 1 MG PO TABS: 1.0000 mg | ORAL_TABLET | Freq: Once | ORAL | Status: DC

## 2013-07-15 ENCOUNTER — Ambulatory Visit: Payer: Medicare Other | Admitting: Vascular Surgery

## 2013-07-15 ENCOUNTER — Encounter (HOSPITAL_COMMUNITY): Payer: Medicare Other

## 2013-07-21 ENCOUNTER — Encounter: Payer: Self-pay | Admitting: Vascular Surgery

## 2013-07-22 ENCOUNTER — Ambulatory Visit (INDEPENDENT_AMBULATORY_CARE_PROVIDER_SITE_OTHER): Payer: Self-pay | Admitting: Vascular Surgery

## 2013-07-22 ENCOUNTER — Encounter: Payer: Self-pay | Admitting: Vascular Surgery

## 2013-07-22 VITALS — BP 138/85 | HR 73 | Resp 16 | Ht 64.0 in | Wt 152.0 lb

## 2013-07-22 DIAGNOSIS — I83893 Varicose veins of bilateral lower extremities with other complications: Secondary | ICD-10-CM

## 2013-07-22 HISTORY — PX: ENDOVENOUS ABLATION SAPHENOUS VEIN W/ LASER: SUR449

## 2013-07-22 NOTE — Progress Notes (Signed)
   Laser Ablation Procedure      Date: 07/22/2013    Sandra Galloway DOB:[birth year]  Consent signed: Yes  Surgeon:T.F. Imajean Mcdermid  Procedure: Laser Ablation: left Greater Saphenous Vein  BP 138/85  Pulse 73  Resp 16  Ht 5\' 4"  (1.626 m)  Wt 152 lb (68.947 kg)  BMI 26.08 kg/m2  Start time: 10:45am   End time: 11:45am  Tumescent Anesthesia: 475 cc 0.9% NaCl with 50 cc Lidocaine HCL with 1% Epi and 15 cc 8.4% NaHCO3  Local Anesthesia: 4 cc Lidocaine HCL and NaHCO3 (ratio 2:1)  Continuous Mode: 15 Watts Total Energy 2349 Joules Total Time2:36   Sclerotherapy: 0.3 %Sotradecol. Patient received a total of 3 cc  (left leg)    Patient tolerated procedure well: Yes    Description of Procedure:  After marking the course of the saphenous vein and the secondary varicosities in the standing position, the patient was placed on the operating table in the supine position, and the left leg was prepped and draped in sterile fashion. Local anesthetic was administered, and under ultrasound guidance the saphenous vein was accessed with a micro needle and guide wire; then the micro puncture sheath was placed. A guide wire was inserted to the saphenofemoral junction, followed by a 5 french sheath.  The position of the sheath and then the laser fiber below the junction was confirmed using the ultrasound and visualization of the aiming beam.  Tumescent anesthesia was administered along the course of the saphenous vein using ultrasound guidance. Protective laser glasses were placed on the patient, and the laser was fired at at 15 watt continuous mode.  For a total of 2349 joules.  A steri strip was applied to the puncture site.   Sclerotherapy was performed to 4  vessels using 3  cc .3% Sotradecol foam via a 30 gauge needle.  ABD pads and thigh high compression stockings were applied.  Ace wrap bandages were applied over the phlebectomy sites and at the top of the saphenofemoral junction.  Blood loss was less  than 15 cc.  The patient ambulated out of the operating room having tolerated the procedure well.

## 2013-07-23 ENCOUNTER — Telehealth: Payer: Self-pay | Admitting: *Deleted

## 2013-07-23 ENCOUNTER — Encounter: Payer: Self-pay | Admitting: Vascular Surgery

## 2013-07-23 NOTE — Telephone Encounter (Signed)
    07/23/2013  Time: 11:30 AM   Patient Name: Sandra Galloway  Patient of: T.F. Early  Procedure:Laser Ablation left greater saphenous vein and sclerotherapy left leg 07-22-2013  Reached patient at home and checked  Her status  Yes    Comments/Actions Taken: [name] states no problems with left leg pain or swelling.  Reviewed post procedural instructions with her and reminded her of post laser ablation duplex and VV FU with Dr. Donnetta Hutching on 07-29-2013.    @SIGNATURE @

## 2013-07-28 ENCOUNTER — Encounter: Payer: Self-pay | Admitting: Vascular Surgery

## 2013-07-29 ENCOUNTER — Ambulatory Visit (INDEPENDENT_AMBULATORY_CARE_PROVIDER_SITE_OTHER): Payer: Self-pay | Admitting: Vascular Surgery

## 2013-07-29 ENCOUNTER — Encounter (HOSPITAL_COMMUNITY): Payer: Medicare Other

## 2013-07-29 ENCOUNTER — Ambulatory Visit (HOSPITAL_COMMUNITY)
Admission: RE | Admit: 2013-07-29 | Discharge: 2013-07-29 | Disposition: A | Discharge status: Home / Self Care | Payer: Medicare Other | Source: Ambulatory Visit | Attending: Vascular Surgery | Admitting: Vascular Surgery

## 2013-07-29 ENCOUNTER — Encounter: Payer: Self-pay | Admitting: Vascular Surgery

## 2013-07-29 VITALS — BP 151/99 | HR 67 | Resp 16 | Ht 64.0 in | Wt 152.0 lb

## 2013-07-29 DIAGNOSIS — I83893 Varicose veins of bilateral lower extremities with other complications: Secondary | ICD-10-CM

## 2013-07-29 DIAGNOSIS — Z9889 Other specified postprocedural states: Secondary | ICD-10-CM | POA: Insufficient documentation

## 2013-07-29 DIAGNOSIS — I8390 Asymptomatic varicose veins of unspecified lower extremity: Secondary | ICD-10-CM | POA: Insufficient documentation

## 2013-07-29 NOTE — Progress Notes (Signed)
.   Today for followup of her left leg great saphenous vein laser ablation and sclerotherapy of painful telangiectasia around her knee and ankle. She's done quite well. She has been very compliant with her compression garments. She reports minimal discomfort.  Past Medical History  Diagnosis Date  . Cancer     right breast  . Varicose veins     History  Substance Use Topics  . Smoking status: Never Smoker   . Smokeless tobacco: Never Used  . Alcohol Use: No    Family History  Problem Relation Age of Onset  . Cancer Sister     breast  . Cancer Mother   . Other Mother     varicose veins  . Heart disease Father   . Hyperlipidemia Father   . Heart attack Father     Allergies  Allergen Reactions  . Amoxicillin     REACTION: rash    Current outpatient prescriptions:levothyroxine (SYNTHROID, LEVOTHROID) 137 MCG tablet, Take 137 mcg by mouth daily., Disp: , Rfl: ;  LORazepam (ATIVAN) 1 MG tablet, Take 1 tablet (1 mg total) by mouth once. Take 1 tablet (1 mg total) by mouth 1 hour prior to procedure., Disp: 2 tablet, Rfl: 0;  pravastatin (PRAVACHOL) 80 MG tablet, Take 80 mg by mouth daily., Disp: , Rfl:   BP 151/99  Pulse 67  Resp 16  Ht 5\' 4"  (1.626 m)  Wt 152 lb (68.947 kg)  BMI 26.08 kg/m2  Body mass index is 26.08 kg/(m^2).       On physical exam he has a minimal bruising in her left medial thigh. She does have occlusion of the telangiectasia physical exam.  Venous duplex today was interpreted by myself and discussed with the patient. This does show ablation of her saphenous vein from the insertion site in the proximal calf to below the saphenofemoral junction and no evidence of DVT  Impression and plan: Good early one week followup after her laser ablation of left great saphenous vein. Patient reports that she can tell improvement in her walking ability. She is scheduled for right leg laser ablation on 09/02/2048

## 2013-08-19 ENCOUNTER — Other Ambulatory Visit: Payer: Medicare Other | Admitting: Vascular Surgery

## 2013-08-24 ENCOUNTER — Ambulatory Visit: Payer: Medicare Other | Admitting: Vascular Surgery

## 2013-08-24 ENCOUNTER — Encounter (HOSPITAL_COMMUNITY): Payer: Medicare Other

## 2013-09-01 ENCOUNTER — Encounter: Payer: Self-pay | Admitting: Vascular Surgery

## 2013-09-02 ENCOUNTER — Encounter: Payer: Self-pay | Admitting: Vascular Surgery

## 2013-09-02 ENCOUNTER — Ambulatory Visit (INDEPENDENT_AMBULATORY_CARE_PROVIDER_SITE_OTHER): Payer: Medicare Other | Admitting: Vascular Surgery

## 2013-09-02 VITALS — BP 123/80 | HR 89 | Resp 16 | Ht 64.0 in | Wt 151.0 lb

## 2013-09-02 DIAGNOSIS — I83893 Varicose veins of bilateral lower extremities with other complications: Secondary | ICD-10-CM

## 2013-09-02 HISTORY — PX: ENDOVENOUS ABLATION SAPHENOUS VEIN W/ LASER: SUR449

## 2013-09-02 NOTE — Progress Notes (Signed)
   Laser Ablation Procedure      Date: 09/02/2013    Sandra Galloway DOB:1946-04-12  Consent signed: Yes  Surgeon:T.F. Nannette Zill  Procedure: Laser Ablation: right Greater Saphenous Vein  BP 123/80  Pulse 89  Resp 16  Ht 5\' 4"  (1.626 m)  Wt 151 lb (68.493 kg)  BMI 25.91 kg/m2  Start time: 8:45 AM   End time: 9:40AM  Tumescent Anesthesia: 425 cc 0.9% NaCl with 50 cc Lidocaine HCL with 1% Epi and 15 cc 8.4% NaHCO3  Local Anesthesia: 4 cc Lidocaine HCL and NaHCO3 (ratio 2:1)  Laser Mode Continuous 15 Watts   Total Energy  2779 Joules   Total Time  3:05     Sclerotherapy: 0.3 %Sotradecol. Patient received a total of 3 cc  Right leg    Patient tolerated procedure well: Yes  Notes: [name] took Ativan 1 mg 1 hour prior to office surgery.  Description of Procedure:  After marking the course of the saphenous vein and the secondary varicosities in the standing position, the patient was placed on the operating table in the supine position, and the right leg was prepped and draped in sterile fashion. Local anesthetic was administered, and under ultrasound guidance the saphenous vein was accessed with a micro needle and guide wire; then the micro puncture sheath was placed. A guide wire was inserted to the saphenofemoral junction, followed by a 5 french sheath.  The position of the sheath and then the laser fiber below the junction was confirmed using the ultrasound and visualization of the aiming beam.  Tumescent anesthesia was administered along the course of the saphenous vein using ultrasound guidance. Protective laser glasses were placed on the patient, and the laser was fired at at 15 watt continuous mode.  For a total of 2779 joules.  A steri strip was applied to the puncture site.  Sclerotherapy was performed to 6  vessels using 3  cc .3% Sotradecol foam via a  30 gauge needle.  ABD pads and thigh high compression stockings were applied.  Ace wrap bandages were applied over the  phlebectomy sites and at the top of the saphenofemoral junction.  Blood loss was less than 15 cc.  The patient ambulated out of the operating room having tolerated the procedure well.

## 2013-09-03 ENCOUNTER — Telehealth: Payer: Self-pay | Admitting: *Deleted

## 2013-09-03 NOTE — Telephone Encounter (Signed)
    09/03/2013  Time: 9:13 AM   Patient Name: Sandra Galloway  Patient of: T.F. Early  Procedure:Laser Ablation and Sclerotherapy right greater saphenous vein (EVLA) and right leg (sclerotherapy)  09-02-2013  Reached patient at home and checked  Her status  Yes    Comments/Actions Taken: [name] states no problems with leg pain or swelling.   Reviewed post procedural instructions with [name] and reminded her of post laser ablation duplex and VV follow up with Dr. Donnetta Hutching on 09-09-2013.      @SIGNATURE @

## 2013-09-06 ENCOUNTER — Encounter: Payer: Self-pay | Admitting: Vascular Surgery

## 2013-09-08 ENCOUNTER — Encounter: Payer: Self-pay | Admitting: Vascular Surgery

## 2013-09-09 ENCOUNTER — Ambulatory Visit (HOSPITAL_COMMUNITY)
Admission: RE | Admit: 2013-09-09 | Discharge: 2013-09-09 | Disposition: A | Discharge status: Home / Self Care | Payer: Medicare Other | Source: Ambulatory Visit | Attending: Vascular Surgery | Admitting: Vascular Surgery

## 2013-09-09 ENCOUNTER — Encounter: Payer: Self-pay | Admitting: Vascular Surgery

## 2013-09-09 ENCOUNTER — Ambulatory Visit (INDEPENDENT_AMBULATORY_CARE_PROVIDER_SITE_OTHER): Payer: Self-pay | Admitting: Vascular Surgery

## 2013-09-09 VITALS — BP 133/88 | HR 72 | Resp 16 | Ht 64.0 in | Wt 151.0 lb

## 2013-09-09 DIAGNOSIS — I83893 Varicose veins of bilateral lower extremities with other complications: Secondary | ICD-10-CM | POA: Insufficient documentation

## 2013-09-09 NOTE — Progress Notes (Signed)
Here today for followup of her saphenous vein ablation on the right on 09/02/2013 2 she had similar treatment on her left leg. She had minimal discomfort associated with this. Just a had a sclerotherapy of a prominent painful telangiectasia.  Physical exam she looks quite good today with minimal bruising. The telangiectasia could be well treated.  Venous duplex shows no evidence of DVT the great saphenous vein on the right is closed from the distal insertion site at her calf to the level of the saphenofemoral junction.  Impression and plan: Stable to up one week after her ablation. She wear compression for additional week and on an as-needed basis

## 2013-10-08 ENCOUNTER — Other Ambulatory Visit (HOSPITAL_COMMUNITY): Payer: Self-pay | Admitting: Endocrinology

## 2013-10-08 DIAGNOSIS — E041 Nontoxic single thyroid nodule: Secondary | ICD-10-CM

## 2013-10-12 ENCOUNTER — Ambulatory Visit (HOSPITAL_COMMUNITY)
Admission: RE | Admit: 2013-10-12 | Discharge: 2013-10-12 | Disposition: A | Discharge status: Home / Self Care | Payer: Medicare Other | Source: Ambulatory Visit | Attending: Endocrinology | Admitting: Endocrinology

## 2013-10-12 DIAGNOSIS — E042 Nontoxic multinodular goiter: Secondary | ICD-10-CM | POA: Insufficient documentation

## 2013-10-12 DIAGNOSIS — E041 Nontoxic single thyroid nodule: Secondary | ICD-10-CM

## 2013-12-22 DIAGNOSIS — E785 Hyperlipidemia, unspecified: Secondary | ICD-10-CM | POA: Insufficient documentation

## 2013-12-22 DIAGNOSIS — Z0000 Encounter for general adult medical examination without abnormal findings: Secondary | ICD-10-CM | POA: Insufficient documentation

## 2013-12-22 DIAGNOSIS — R030 Elevated blood-pressure reading, without diagnosis of hypertension: Secondary | ICD-10-CM | POA: Insufficient documentation

## 2013-12-22 DIAGNOSIS — E039 Hypothyroidism, unspecified: Secondary | ICD-10-CM | POA: Insufficient documentation

## 2014-06-17 HISTORY — PX: COLONOSCOPY: SHX174

## 2014-06-17 HISTORY — PX: POLYPECTOMY: SHX149

## 2014-07-25 DIAGNOSIS — Z79899 Other long term (current) drug therapy: Secondary | ICD-10-CM | POA: Insufficient documentation

## 2014-07-25 DIAGNOSIS — R000 Tachycardia, unspecified: Secondary | ICD-10-CM | POA: Insufficient documentation

## 2014-08-24 ENCOUNTER — Encounter: Payer: Self-pay | Admitting: Internal Medicine

## 2014-08-31 ENCOUNTER — Other Ambulatory Visit (HOSPITAL_COMMUNITY): Payer: Self-pay | Admitting: Endocrinology

## 2014-08-31 DIAGNOSIS — E041 Nontoxic single thyroid nodule: Secondary | ICD-10-CM

## 2014-12-16 ENCOUNTER — Ambulatory Visit (HOSPITAL_COMMUNITY)
Admission: RE | Admit: 2014-12-16 | Discharge: 2014-12-16 | Disposition: A | Discharge status: Home / Self Care | Payer: Medicare Other | Source: Ambulatory Visit | Attending: Endocrinology | Admitting: Endocrinology

## 2014-12-16 DIAGNOSIS — E041 Nontoxic single thyroid nodule: Secondary | ICD-10-CM

## 2014-12-16 DIAGNOSIS — E042 Nontoxic multinodular goiter: Secondary | ICD-10-CM | POA: Diagnosis not present

## 2014-12-27 ENCOUNTER — Encounter: Payer: Self-pay | Admitting: Genetic Counselor

## 2015-03-09 ENCOUNTER — Encounter: Payer: Self-pay | Admitting: Internal Medicine

## 2015-03-16 ENCOUNTER — Encounter: Payer: Self-pay | Admitting: Internal Medicine

## 2015-03-29 ENCOUNTER — Encounter: Payer: Self-pay | Admitting: Gastroenterology

## 2015-05-10 ENCOUNTER — Ambulatory Visit: Payer: Medicare Other

## 2015-05-10 VITALS — Ht 64.0 in | Wt 155.8 lb

## 2015-05-10 DIAGNOSIS — Z800 Family history of malignant neoplasm of digestive organs: Secondary | ICD-10-CM

## 2015-05-10 MED ORDER — SUPREP BOWEL PREP KIT 17.5-3.13-1.6 GM/177ML PO SOLN: 1.0000 | Freq: Once | ORAL | Status: DC

## 2015-05-10 NOTE — Progress Notes (Signed)
No allergies to eggs or soy No home oxygen No diet/weight loss meds No past problems with anesthesia  Has email and internet; refused emmi

## 2015-05-24 ENCOUNTER — Encounter: Payer: Self-pay | Admitting: Gastroenterology

## 2015-05-24 ENCOUNTER — Ambulatory Visit (AMBULATORY_SURGERY_CENTER): Payer: Medicare Other | Admitting: Gastroenterology

## 2015-05-24 VITALS — BP 126/79 | HR 75 | Temp 97.8°F | Resp 13 | Ht 64.0 in | Wt 155.0 lb

## 2015-05-24 DIAGNOSIS — Z800 Family history of malignant neoplasm of digestive organs: Secondary | ICD-10-CM

## 2015-05-24 DIAGNOSIS — D123 Benign neoplasm of transverse colon: Secondary | ICD-10-CM | POA: Diagnosis not present

## 2015-05-24 DIAGNOSIS — D120 Benign neoplasm of cecum: Secondary | ICD-10-CM | POA: Diagnosis not present

## 2015-05-24 DIAGNOSIS — Z1211 Encounter for screening for malignant neoplasm of colon: Secondary | ICD-10-CM

## 2015-05-24 MED ORDER — SODIUM CHLORIDE 0.9 % IV SOLN: 500.0000 mL | INTRAVENOUS | Status: DC

## 2015-05-24 NOTE — Progress Notes (Signed)
A/ox3 pleased with MAC, report to Karen RN 

## 2015-05-24 NOTE — Progress Notes (Signed)
Called to room to assist during endoscopic procedure.  Patient ID and intended procedure confirmed with present staff. Received instructions for my participation in the procedure from the performing physician.  

## 2015-05-24 NOTE — Patient Instructions (Signed)
Handouts given for Polyps and Diverticulosis  YOU HAD AN ENDOSCOPIC PROCEDURE TODAY AT [facility]:   Refer to the procedure report that was given to you for any specific questions about what was found during the examination.  If the procedure report does not answer your questions, please call your gastroenterologist to clarify.  If you requested that your care partner not be given the details of your procedure findings, then the procedure report has been included in a sealed envelope for you to review at your convenience later.  YOU SHOULD EXPECT: Some feelings of bloating in the abdomen. Passage of more gas than usual.  Walking can help get rid of the air that was put into your GI tract during the procedure and reduce the bloating. If you had a lower endoscopy (such as a colonoscopy or flexible sigmoidoscopy) you may notice spotting of blood in your stool or on the toilet paper. If you underwent a bowel prep for your procedure, you may not have a normal bowel movement for a few days.  Please Note:  You might notice some irritation and congestion in your nose or some drainage.  This is from the oxygen used during your procedure.  There is no need for concern and it should clear up in a day or so.  SYMPTOMS TO REPORT IMMEDIATELY:   Following lower endoscopy (colonoscopy or flexible sigmoidoscopy):  Excessive amounts of blood in the stool  Significant tenderness or worsening of abdominal pains  Swelling of the abdomen that is new, acute  Fever of 100F or higher   For urgent or emergent issues, a gastroenterologist can be reached at any hour by calling [phone].   DIET: Your first meal following the procedure should be a small meal and then it is ok to progress to your normal diet. Heavy or fried foods are harder to digest and may make you feel nauseous or bloated.  Likewise, meals heavy in dairy and vegetables can increase bloating.  Drink plenty of fluids but you should  avoid alcoholic beverages for 24 hours.  ACTIVITY:  You should plan to take it easy for the rest of today and you should NOT DRIVE or use heavy machinery until tomorrow (because of the sedation medicines used during the test).    FOLLOW UP: Our staff will call the number listed on your records the next business day following your procedure to check on you and address any questions or concerns that you may have regarding the information given to you following your procedure. If we do not reach you, we will leave a message.  However, if you are feeling well and you are not experiencing any problems, there is no need to return our call.  We will assume that you have returned to your regular daily activities without incident.  If any biopsies were taken you will be contacted by phone or by letter within the next 1-3 weeks.  Please call us at [phone] if you have not heard about the biopsies in 3 weeks.    SIGNATURES/CONFIDENTIALITY: You and/or your care partner have signed paperwork which will be entered into your electronic medical record.  These signatures attest to the fact that that the information above on your After Visit Summary has been reviewed and is understood.  Full responsibility of the confidentiality of this discharge information lies with you and/or your care-partner.

## 2015-05-24 NOTE — Op Note (Signed)
[facility]  [address] [place], [id number]   COLONOSCOPY PROCEDURE REPORT  PATIENT: Sandra Galloway, Sandra Galloway  MR#: [id number] BIRTHDATE: 19-Jun-1945 , 69  yrs. old GENDER: female ENDOSCOPIST: Harl Bowie, MD REFERRED [id number] Aguiar, M.D. PROCEDURE DATE:  05/24/2015 PROCEDURE:   Colonoscopy, screening and Colonoscopy with snare polypectomy First Screening Colonoscopy - Avg.  risk and is 50 yrs.  old or older - No.  Prior Negative Screening - Now for repeat screening. Less than 10 yrs Prior Negative Screening - Now for repeat screening.  Inadequate prep  History of Adenoma - Now for follow-up colonoscopy & has been > or = to 3 yrs.  N/A  Polyps removed today? Yes ASA CLASS:   Class II INDICATIONS:Screening for colonic neoplasia and Colorectal Neoplasm Risk Assessment for this procedure is average risk. MEDICATIONS: Propofol 300 mg IV  DESCRIPTION OF PROCEDURE:   After the risks benefits and alternatives of the procedure were thoroughly explained, informed consent was obtained.  The digital rectal exam revealed no abnormalities of the rectum.   The LB [id number]  endoscope was introduced through the anus and advanced to the cecum, which was identified by both the appendix and ileocecal valve. No adverse events experienced.   The quality of the prep was good.  The instrument was then slowly withdrawn as the colon was fully examined. Estimated blood loss is zero unless otherwise noted in this procedure report.   COLON FINDINGS: Two sessile polyps ranging between 5-37mm in size were found in the transverse colon and at the cecum.  A polypectomy was performed with a cold snare.  The resection was complete, the polyp tissue was completely retrieved and sent to histology. There was mild diverticulosis noted in the sigmoid colon and ascending colon.  Retroflexed views revealed internal hemorrhoids. The time to cecum = 7.7 Withdrawal time = 19.6   The scope  was withdrawn and the procedure completed. COMPLICATIONS: There were no immediate complications.  ENDOSCOPIC IMPRESSION: 1.   Two sessile polyps ranging between 3-16mm in size were found in the transverse colon and at the cecum; polypectomy was performed with a cold snare 2.   Mild diverticulosis was noted in the sigmoid colon and ascending colon  RECOMMENDATIONS: 1.  If the polyp(s) removed today are proven to be adenomatous (pre-cancerous) polyps, you will need a repeat colonoscopy in 5 years.  Otherwise you should continue to follow colorectal cancer screening guidelines for "routine risk" patients with colonoscopy in 10 years.  You will receive a letter within 1-2 weeks with the results of your biopsy as well as final recommendations.  Please call my office if you have not received a letter after 3 weeks. 2.  Await pathology results  eSigned:  Harl Bowie, MD 05/24/2015 2:05 PM

## 2015-05-25 ENCOUNTER — Telehealth: Payer: Self-pay

## 2015-05-25 NOTE — Telephone Encounter (Signed)
  Follow up Call-  Call back number 05/24/2015  Post procedure Call Back phone  # [phone]  Permission to leave phone message Yes    Patient was called for follow up visit from procedure on 05/24/15. The number that was given is no longer a working number. The number was called multiple times to assure accuracy. There was no way to leave a message.

## 2015-06-05 ENCOUNTER — Encounter: Payer: Self-pay | Admitting: Gastroenterology

## 2015-08-16 ENCOUNTER — Other Ambulatory Visit (HOSPITAL_COMMUNITY): Payer: Self-pay | Admitting: Endocrinology

## 2015-08-16 DIAGNOSIS — E041 Nontoxic single thyroid nodule: Secondary | ICD-10-CM

## 2015-09-27 ENCOUNTER — Other Ambulatory Visit: Payer: Self-pay | Admitting: Obstetrics

## 2015-09-27 DIAGNOSIS — E7800 Pure hypercholesterolemia, unspecified: Secondary | ICD-10-CM | POA: Insufficient documentation

## 2015-09-27 DIAGNOSIS — E079 Disorder of thyroid, unspecified: Secondary | ICD-10-CM | POA: Insufficient documentation

## 2015-09-29 LAB — CYTOLOGY - PAP

## 2015-10-27 DIAGNOSIS — E042 Nontoxic multinodular goiter: Secondary | ICD-10-CM | POA: Insufficient documentation

## 2015-10-27 DIAGNOSIS — Z23 Encounter for immunization: Secondary | ICD-10-CM | POA: Insufficient documentation

## 2015-10-27 DIAGNOSIS — M25562 Pain in left knee: Secondary | ICD-10-CM | POA: Insufficient documentation

## 2015-10-27 DIAGNOSIS — M25561 Pain in right knee: Secondary | ICD-10-CM | POA: Insufficient documentation

## 2015-10-27 DIAGNOSIS — Z1159 Encounter for screening for other viral diseases: Secondary | ICD-10-CM | POA: Insufficient documentation

## 2015-12-25 ENCOUNTER — Ambulatory Visit (HOSPITAL_COMMUNITY): Payer: Medicare Other

## 2016-01-01 ENCOUNTER — Ambulatory Visit (HOSPITAL_COMMUNITY)
Admission: RE | Admit: 2016-01-01 | Discharge: 2016-01-01 | Disposition: A | Discharge status: Home / Self Care | Payer: Medicare Other | Source: Ambulatory Visit | Attending: Endocrinology | Admitting: Endocrinology

## 2016-01-01 DIAGNOSIS — E042 Nontoxic multinodular goiter: Secondary | ICD-10-CM | POA: Diagnosis not present

## 2016-01-01 DIAGNOSIS — E041 Nontoxic single thyroid nodule: Secondary | ICD-10-CM

## 2016-04-23 DIAGNOSIS — G8929 Other chronic pain: Secondary | ICD-10-CM | POA: Insufficient documentation

## 2016-08-20 DIAGNOSIS — R6889 Other general symptoms and signs: Secondary | ICD-10-CM | POA: Insufficient documentation

## 2017-01-06 ENCOUNTER — Other Ambulatory Visit: Payer: Self-pay | Admitting: Surgery

## 2017-01-06 DIAGNOSIS — E042 Nontoxic multinodular goiter: Secondary | ICD-10-CM

## 2017-01-09 ENCOUNTER — Ambulatory Visit (HOSPITAL_COMMUNITY)
Admission: RE | Admit: 2017-01-09 | Discharge: 2017-01-09 | Disposition: A | Discharge status: Home / Self Care | Payer: Medicare Other | Source: Ambulatory Visit | Attending: Surgery | Admitting: Surgery

## 2017-01-09 DIAGNOSIS — E042 Nontoxic multinodular goiter: Secondary | ICD-10-CM | POA: Diagnosis not present

## 2017-04-28 DIAGNOSIS — S62624D Displaced fracture of medial phalanx of right ring finger, subsequent encounter for fracture with routine healing: Secondary | ICD-10-CM | POA: Insufficient documentation

## 2017-12-09 ENCOUNTER — Encounter: Payer: Self-pay | Admitting: Neurology

## 2018-03-13 ENCOUNTER — Encounter: Payer: Self-pay | Admitting: Neurology

## 2018-03-13 ENCOUNTER — Encounter

## 2018-03-13 ENCOUNTER — Ambulatory Visit (INDEPENDENT_AMBULATORY_CARE_PROVIDER_SITE_OTHER): Payer: Medicare Other | Admitting: Neurology

## 2018-03-13 VITALS — BP 130/90 | HR 78 | Ht 64.0 in | Wt 145.0 lb

## 2018-03-13 DIAGNOSIS — R430 Anosmia: Secondary | ICD-10-CM | POA: Diagnosis not present

## 2018-03-13 DIAGNOSIS — H9193 Unspecified hearing loss, bilateral: Secondary | ICD-10-CM | POA: Diagnosis not present

## 2018-03-13 DIAGNOSIS — R4189 Other symptoms and signs involving cognitive functions and awareness: Secondary | ICD-10-CM

## 2018-03-13 NOTE — Patient Instructions (Addendum)
It was very nice to see you today.  You do not have any signs of dementia or Parkinson's disease. We will check MRI brain and call you with the results.

## 2018-03-13 NOTE — Progress Notes (Signed)
[facility] Clinic Note - Initial Visit   Date: 03/13/18  Sandra Galloway MRN: [id number] DOB: [birth year]   Dear Dr. Ruben Gottron:  Thank you for your kind referral of Sandra Galloway for consultation of memory changes. Although her history is well known to you, please allow [place] to reiterate it for the purpose of our medical record. The patient was accompanied to the clinic by self.    History of Present Illness: Sandra Galloway is a 72 y.o. right-handed Caucasian female with right breast cancer s/p lumpectomy, radiation, and chemotherapy (1996), hypothyroidism, an hyperlipidemia presenting for evaluation of changes in sense of smell.    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  She does not have change in taste.  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  Her mother had dementia and she is very concerned about this because she has read that lack smell can indicate dementia or Parkinson's disease.  She is highly independent and live alone with her 83lb black lab.  She has some difficulty with recalling names or events, but quickly can retreive information, especially if prompted.  She manages her own finances, drives, and manages medications.  She stays active and does Silver Sneakers, swims, and walks two miles daily. 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  She denies headaches, facial pain, weakness, or numbness/tingling.  No history of head trauma.    Past Medical History:  Diagnosis Date  . Cancer [facility])    right breast  . Varicose veins     Past Surgical History:  Procedure Laterality Date  . BREAST LUMPECTOMY  1996   right breast  . ENDOVENOUS ABLATION SAPHENOUS VEIN W/ LASER Left 07-22-2013   left greater saphenous vein and sclerotherapy (left leg)  Sherren Mocha Early MD   . ENDOVENOUS ABLATION SAPHENOUS VEIN  W/ LASER Right 09-02-2013   right greater saphenous vein and sclerotherapy right leg  by Curt Jews MD  . TUBAL LIGATION       Medications:  Outpatient Encounter Medications as of 03/13/2018  Medication Sig  . Multiple Vitamin (MULTIVITAMIN) tablet Take 1 tablet by mouth daily.  Marland Kitchen levothyroxine (SYNTHROID, LEVOTHROID) 137 MCG tablet Take 137 mcg by mouth daily.  . NON FORMULARY Tumeric  . pravastatin (PRAVACHOL) 80 MG tablet Take 80 mg by mouth daily.   No facility-administered encounter medications on file as of 03/13/2018.      Allergies:  Allergies  Allergen Reactions  . Amoxicillin     REACTION: rash    Family History: Family History  Problem Relation Age of Onset  . Cancer Mother   . Other Mother        varicose veins  . Colon cancer Mother   . Dementia Mother   . Heart disease Father   . Hyperlipidemia Father   . Heart attack Father   . Cancer Sister        breast    Social History: Social History   Tobacco Use  . Smoking status: Never Smoker  . Smokeless tobacco: Never Used  Substance Use Topics  . Alcohol use: No  . Drug use: No   Social History   Social History Narrative  . Not on file    Review of Systems:  CONSTITUTIONAL: No fevers, chills, night sweats, or weight loss.  EYES: No visual changes or eye pain ENT: No hearing changes.  No history of nose bleeds.   RESPIRATORY: No cough, wheezing and shortness of breath.   CARDIOVASCULAR: Negative for chest pain, and palpitations.   GI: Negative for abdominal discomfort, blood in stools or black stools.  No recent change in bowel habits.   GU:  No history of incontinence.   MUSCLOSKELETAL: No history of joint pain or swelling.  No myalgias.   SKIN: Negative for lesions, rash, and itching.   HEMATOLOGY/ONCOLOGY: Negative for prolonged bleeding, bruising easily, and swollen nodes.  No history of cancer.   ENDOCRINE: Negative for cold or heat intolerance, polydipsia or goiter.   PSYCH:  No  depression or anxiety symptoms.   NEURO: As Above.   Vital Signs:  BP 130/90   Pulse 78   Ht 5\' 4"  (1.626 m)   Wt 145 lb (65.8 kg)   SpO2 98%   BMI 24.89 kg/m    General Medical Exam:   General:  Well appearing, comfortable.   Eyes/ENT: see cranial nerve examination.   Neck: No masses appreciated.  Full range of motion without tenderness.  No carotid bruits. Respiratory:  Clear to auscultation, good air entry bilaterally.   Cardiac:  Regular rate and rhythm, no murmur.   Extremities:  No deformities, edema, or skin discoloration.  Skin:  No rashes or lesions.  Neurological Exam: MENTAL STATUS including orientation to time, place, person, recent and remote memory, attention span and concentration, language, and fund of knowledge is normal.  Speech is not dysarthric. Montreal Cognitive Assessment  03/13/2018  Visuospatial/ Executive (0/5) 5  Naming (0/3) 3  Attention: Read list of digits (0/2) 2  Attention: Read list of letters (0/1) 1  Attention: Serial 7 subtraction starting at 100 (0/3) 3  Language: Repeat phrase (0/2) 2  Language : Fluency (0/1) 1  Abstraction (0/2) 2  Delayed Recall (0/5) 4  Orientation (0/6) 6  Total 29  Adjusted Score (based on education) 29    CRANIAL NERVES: II:  No visual field defects.  Unremarkable fundi.   III-IV-VI: Pupils equal round and reactive to light.  Normal conjugate, extra-ocular eye movements in all directions of gaze.  No nystagmus.  No ptosis.   V:  Normal facial sensation.  Jaw jerk is absent.   VII:  Normal facial symmetry and movements.  No pathologic facial reflexes.  VIII:  Normal hearing and vestibular function.   IX-X:  Normal palatal movement.   XI:  Normal shoulder shrug and head rotation.   XII:  Normal tongue strength and range of motion, no deviation or fasciculation.  MOTOR:  Motor strength is 5/5 throughout. No atrophy, fasciculations or abnormal movements.  No pronator drift.  Tone is normal.    MSRs:  Right                                                                  Left brachioradialis 2+  brachioradialis 2+  biceps 2+  biceps 2+  triceps 2+  triceps 2+  patellar 2+  patellar 2+  ankle jerk 1+  ankle jerk 1+  Hoffman no  Hoffman no  plantar response down  plantar response down   SENSORY:  Normal and symmetric perception of light touch,  pinprick, vibration, and proprioception.    COORDINATION/GAIT: Normal finger-to- nose-finger and heel-to-shin.  Intact rapid alternating movements bilaterally. Gait narrow based and stable. Tandem and stressed gait intact.    IMPRESSION: 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  Her exam today looks remarkably well and she scored 29/30 on her cognitive screening, missing only one point for delayed recall.   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  Reassurance was provided.  I also discussed other conditions which can cause changes in smell such as anterior fossa meningiomas, infection, or normal aging.  MRI brain wo contrast will be ordered to evaluation for structural pathology, but my overall suspicion is low given absence of upper motor neuron findings on exam.  She is already very active, which I encouraged, as this has the most data to be protective against cognitive slowing. All questions were answered.    Thank you for allowing me to participate in patient's care.  If I can answer any additional questions, I would be pleased to do so.    Sincerely,    Haislee Corso K. Posey Pronto, DO

## 2018-03-17 ENCOUNTER — Other Ambulatory Visit (HOSPITAL_COMMUNITY): Payer: Self-pay | Admitting: Endocrinology

## 2018-03-17 DIAGNOSIS — E010 Iodine-deficiency related diffuse (endemic) goiter: Secondary | ICD-10-CM

## 2018-03-30 ENCOUNTER — Ambulatory Visit (HOSPITAL_COMMUNITY)
Admission: RE | Admit: 2018-03-30 | Discharge: 2018-03-30 | Disposition: A | Discharge status: Home / Self Care | Payer: Medicare Other | Source: Ambulatory Visit | Attending: Endocrinology | Admitting: Endocrinology

## 2018-03-30 DIAGNOSIS — E010 Iodine-deficiency related diffuse (endemic) goiter: Secondary | ICD-10-CM | POA: Diagnosis present

## 2019-01-26 ENCOUNTER — Telehealth: Payer: Self-pay | Admitting: Neurology

## 2019-01-26 ENCOUNTER — Ambulatory Visit
Admission: RE | Admit: 2019-01-26 | Discharge: 2019-01-26 | Disposition: A | Discharge status: Home / Self Care | Payer: Medicare Other | Source: Ambulatory Visit | Attending: Neurology | Admitting: Neurology

## 2019-01-26 ENCOUNTER — Other Ambulatory Visit: Payer: Self-pay

## 2019-01-26 DIAGNOSIS — R430 Anosmia: Secondary | ICD-10-CM

## 2019-01-26 NOTE — Telephone Encounter (Signed)
MRI order 03/13/18 never done Spoke with patient they did contact her to schedule appt she was driving in never call them back to schedule. Requesting contact information to call to set up MRI appt.  Pt given information to call to schedule appt. If new order is needed she will contact the office to place.

## 2019-01-26 NOTE — Telephone Encounter (Signed)
Patient is calling in wanting to speak to someone about MRI that was to be set up about a year ago. Thanks!

## 2019-02-11 ENCOUNTER — Encounter: Payer: Self-pay | Admitting: Neurology

## 2019-02-12 ENCOUNTER — Telehealth (INDEPENDENT_AMBULATORY_CARE_PROVIDER_SITE_OTHER): Payer: Medicare Other | Admitting: Neurology

## 2019-02-12 ENCOUNTER — Other Ambulatory Visit: Payer: Self-pay

## 2019-02-12 VITALS — Ht 64.0 in | Wt 145.0 lb

## 2019-02-12 DIAGNOSIS — R430 Anosmia: Secondary | ICD-10-CM

## 2019-02-12 NOTE — Progress Notes (Signed)
   Virtual Visit via Video Note The purpose of this virtual visit is to provide medical care while limiting exposure to the novel coronavirus.    Consent was obtained for video visit:  Yes.   Answered questions that patient had about telehealth interaction:  Yes.   I discussed the limitations, risks, security and privacy concerns of performing an evaluation and management service by telemedicine. I also discussed with the patient that there may be a patient responsible charge related to this service. The patient expressed understanding and agreed to proceed.  Pt location: Home Physician Location: office Name of referring provider:  Robyne Peers, MD I connected with Eulis Manly at patients initiation/request on 02/12/2019 at 10:30 AM EDT by video enabled telemedicine application and verified that I am speaking with the correct person using two identifiers. Pt MRN:  [id number] Pt DOB:  01-13-1946 Video Participants:  Eulis Manly   History of Present Illness: This is a 73 y.o. female with female with right breast cancer s/p lumpectomy, radiation, and chemotherapy (1996), hypothyroidism, an hyperlipidemia returning for follow-up of anosmia.  She is here for follow-up after MRI brain was performed to evaluate for anosmia.  MRI brain showed atrophy olfactory bulbs, no mass lesion or other structural abnormalities.  Symptoms have been constant since 2018 with complete absence of any smell sensation.  Taste is overall okay, she may have very mild change to this.  She denies any new neurological complaints.  She remains highly active and independent.   Observations/Objective:   Vitals:   02/11/19 1313  Weight: 145 lb (65.8 kg)  Height: 5\' 4"  (1.626 m)   Patient is awake, alert, and appears comfortable.  Oriented x 4.   Extraocular muscles are intact. No ptosis.  Face is symmetric.  Speech is not dysarthric.  Antigravity in all extremities.  No pronator drift.  Finger tapping and heel  tapping is normal bilaterally. Gait appears normal, unassisted.  Stressed gait is intact and performed without any difficulty.  DATA: MRI brain wo contrast 01/27/2019: 1. Both olfactory bulbs are difficult to visualize and may be atrophied. 2. Otherwise largely unremarkable for age noncontrast MRI appearance of the brain; mild white matter signal changes which are most commonly due to small vessel disease.  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  There is no evidence of other structural abnormalities to explain her symptoms.  Patient was reassured that there is no mass lesion.  Her history and symptomology does not suggest neurodegenerative conditions such as Parkinson's.  Should she develop any new neurological symptoms, I am more than happy to see her back.  Follow Up Instructions:   I discussed the assessment and treatment plan with the patient. The patient was provided an opportunity to ask questions and all were answered. The patient agreed with the plan and demonstrated an understanding of the instructions.   The patient was advised to call back or seek an in-person evaluation if the symptoms worsen or if the condition fails to improve as anticipated.  Follow-up as needed  Total time spent:  15 minutes     Alda Berthold, DO

## 2019-07-19 ENCOUNTER — Ambulatory Visit: Payer: Medicare Other

## 2019-07-23 ENCOUNTER — Ambulatory Visit: Payer: Medicare Other | Attending: Internal Medicine

## 2019-07-23 DIAGNOSIS — Z23 Encounter for immunization: Secondary | ICD-10-CM | POA: Insufficient documentation

## 2019-07-23 NOTE — Progress Notes (Signed)
   Covid-19 Vaccination Clinic  Name:  Sandra Galloway    MRN: [id number] DOB: Jan 24, 1946  07/23/2019  [name] was observed post Covid-19 immunization for 15 minutes without incidence. She was provided with Vaccine Information Sheet and instruction to access the V-Safe system.   [name] was instructed to call 911 with any severe reactions post vaccine: Marland Kitchen Difficulty breathing  . Swelling of your face and throat  . A fast heartbeat  . A bad rash all over your body  . Dizziness and weakness    Immunizations Administered    Name Date Dose VIS Date Route   Pfizer COVID-19 Vaccine 07/23/2019  4:05 PM 0.3 mL 05/28/2019 Intramuscular   Manufacturer: [place]   Lot: [id number]   [place]: [id number]

## 2019-08-17 ENCOUNTER — Ambulatory Visit: Payer: Medicare Other | Attending: Internal Medicine

## 2019-08-17 DIAGNOSIS — Z23 Encounter for immunization: Secondary | ICD-10-CM | POA: Insufficient documentation

## 2019-08-17 NOTE — Progress Notes (Signed)
   Covid-19 Vaccination Clinic  Name:  Sandra Galloway    MRN: [id number] DOB: 08-04-1945  08/17/2019  [name] was observed post Covid-19 immunization for 15 minutes without incident. She was provided with Vaccine Information Sheet and instruction to access the V-Safe system.   [name] was instructed to call 911 with any severe reactions post vaccine: Marland Kitchen Difficulty breathing  . Swelling of face and throat  . A fast heartbeat  . A bad rash all over body  . Dizziness and weakness   Immunizations Administered    Name Date Dose VIS Date Route   Pfizer COVID-19 Vaccine 08/17/2019  3:38 PM 0.3 mL 05/28/2019 Intramuscular   Manufacturer: [place]   Lot: [id number]   [place]: [id number]

## 2019-08-19 ENCOUNTER — Other Ambulatory Visit (HOSPITAL_COMMUNITY): Payer: Self-pay | Admitting: Endocrinology

## 2019-08-19 DIAGNOSIS — E010 Iodine-deficiency related diffuse (endemic) goiter: Secondary | ICD-10-CM

## 2019-08-27 ENCOUNTER — Other Ambulatory Visit: Payer: Self-pay

## 2019-08-27 ENCOUNTER — Ambulatory Visit (HOSPITAL_COMMUNITY)
Admission: RE | Admit: 2019-08-27 | Discharge: 2019-08-27 | Disposition: A | Discharge status: Home / Self Care | Payer: Medicare Other | Source: Ambulatory Visit | Attending: Endocrinology | Admitting: Endocrinology

## 2019-08-27 DIAGNOSIS — E010 Iodine-deficiency related diffuse (endemic) goiter: Secondary | ICD-10-CM

## 2020-02-03 ENCOUNTER — Encounter: Payer: Self-pay | Admitting: Genetic Counselor

## 2020-07-21 ENCOUNTER — Other Ambulatory Visit: Payer: Self-pay | Admitting: Certified Nurse Midwife

## 2020-07-21 DIAGNOSIS — E559 Vitamin D deficiency, unspecified: Secondary | ICD-10-CM

## 2020-07-21 DIAGNOSIS — Z780 Asymptomatic menopausal state: Secondary | ICD-10-CM

## 2020-08-03 ENCOUNTER — Encounter: Payer: Self-pay | Admitting: Gastroenterology

## 2020-08-08 NOTE — Progress Notes (Signed)
[facility]    Provider:  Dr Jaynee Eagles Requesting Provider: Hayden Rasmussen, MD Primary Care Provider:  Hayden Rasmussen, MD  CC:  Anosmia, subjective memory changes  HPI:  Sandra Galloway is a 75 y.o. female here as requested by Hayden Rasmussen, MD for anosmia and low hearing.  Past medical history hypothyroidism, hyperlipidemia, allergic rhinitis, asthma, unspecified fall, basal cell carcinoma skin of the nose, hypertension.  I reviewed notes from [facility] Pollen: Anosmia and low hearing, patient requesting neurology opinion for reassurance, at last appointment patient was evaluated for elevated blood pressure readings, no personal history of hypertension however strong family history of hypertension, at the time she denied headache, visual changes, chest pain, shortness of breath or focal neurologic deficits.  However she did note anosmia and low hearing, she has never experienced a COVID-19 infection, she does relate a recent increase in sodium in her diet, less activity than typical, I reviewed Dr. Dennie Fetters examination which showed normal head, eyes, ears, nose, throat, neck, chest, heart, abdomen, back, extremities, neuro and skin.  Her husband has parkinson's disease and she is worried because of her anosmia. She has not had rem sleep disorder. Her mother passed away at 10 with dementia. 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 Her recall is not as good. She can recall names but it takes her a minute. Her hearing loss was gradualy. Smell was very quickly affected. About 3 years, she cannot smell things, almost no sense of smell, She still has a good appetite, taste is affected but still enjoys food, no tremor, no falls, no difficulty swallowing, no dizziness or vertigo, walking is fine. No head trauma. No sinus infection. She has not seen ENT to make sure there is nothing structural or polyps. It takes her a  minute to think of a word sometimes. She cares for her husband who has parkinson's disease, he is driving, she is driving, she has not had any problems managing money, not getting lost, no on ein the family has expressed concerns for her memory. She has an older brother who has no dementia. Sister is fine as well. Sheis on synthroid and follows that with pcp, her pcp also put her on vitamin D supplementation. She compensates with her memory. No other focal neurologic deficits, associated symptoms, inciting events or modifiable factors.  Reviewed notes, labs and imaging from outside physicians, which showed:  01/26/2019; MRI brain for loss of smell for 18 months.   Personally reviewed images and agree with the following:   Brain: No restricted diffusion to suggest acute infarction. No midline shift, mass effect, evidence of mass lesion, ventriculomegaly, extra-axial collection or acute intracranial hemorrhage. Cervicomedullary junction and pituitary are within normal limits.  Olfactory findings are below.  Mild for age scattered nonspecific cerebral white matter T2 and FLAIR hyperintensity. No cortical encephalomalacia or chronic blood products. Mildly dilated perivascular spaces in the basal ganglia (normal variant).  Vascular: Major intracranial vascular flow voids are preserved. Tortuous distal vertebral arteries.  Skull and upper cervical spine: Negative visible cervical spine. Visualized bone marrow signal is within normal limits.  Sinuses/Orbits: Negative orbits. Paranasal sinuses are well pneumatized. Mastoid air cells are clear. Visible internal auditory structures appear normal.  Other: There is susceptibility artifact in the left face probably due to dental hardware.  The olfactory bulbs are difficult to visualize despite thin slice imaging (series 13, image 15). The inferior frontal gyri  otherwise appear normal. Unremarkable nasal cavity, mild rightward  septal deviation.  IMPRESSION: 1. Both olfactory bulbs are difficult to visualize and may be atrophied. 2. Otherwise largely unremarkable for age noncontrast MRI appearance of the brain; mild white matter signal changes which are most commonly due to small vessel disease.  Review of Systems: Patient complains of symptoms per HPI as well as the following symptoms: anosmia. Pertinent negatives and positives per HPI. All others negative.   Social History   Socioeconomic History  . Marital status: Married    Spouse name: Not on file  . Number of children: 3  . Years of education: 46  . Highest education level: Some college, no degree  Occupational History  . Occupation: retired  Tobacco Use  . Smoking status: Never Smoker  . Smokeless tobacco: Never Used  Vaping Use  . Vaping Use: Never used  Substance and Sexual Activity  . Alcohol use: No  . Drug use: No  . Sexual activity: Not on file  Other Topics Concern  . Not on file  Social History Narrative   Lives at home with husband in a one story home.  Has 3 children.  Retired from [employer].  Education: some college.     Right handed   Caffeine: 2 cups/day, was more   Social Determinants of Health   Financial Resource Strain: Not on file  Food Insecurity: Not on file  Transportation Needs: Not on file  Physical Activity: Not on file  Stress: Not on file  Social Connections: Not on file  Intimate Partner Violence: Not on file    Family History  Problem Relation Age of Onset  . Cancer Mother   . Other Mother        varicose veins  . Colon cancer Mother   . Dementia Mother   . Heart disease Father   . Hyperlipidemia Father   . Heart attack Father   . Pancreatic cancer Father   . Cancer Sister        breast    Past Medical History:  Diagnosis Date  . Allergic rhinitis   . Basal cell carcinoma (BCC) of skin of nose   . Cancer ([place])    right breast  . Effusion, right knee    . Essential (primary) hypertension   . Hyperlipidemia   . Hypothyroidism   . Varicose veins     Patient Active Problem List   Diagnosis Date Noted  . 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 Varicose veins of lower extremities with other complications 76/22/6333  . Chronic venous insufficiency 01/29/2013  . Thyroid nodule, uninodular 08/10/2012    Past Surgical History:  Procedure Laterality Date  . BREAST LUMPECTOMY  1996   right breast  . ENDOVENOUS ABLATION SAPHENOUS VEIN W/ LASER Left 07-22-2013   left greater saphenous vein and sclerotherapy (left leg)  Curt Jews MD   . ENDOVENOUS ABLATION SAPHENOUS VEIN W/ LASER Right 09-02-2013   right greater saphenous vein and sclerotherapy right leg  by Curt Jews MD  . TUBAL LIGATION  1984    Current Outpatient Medications  Medication Sig Dispense Refill  . ergocalciferol (VITAMIN D2) 1.25 MG (50000 UT) capsule Take 50,000 Units by mouth once a week.    . levothyroxine (SYNTHROID) 75 MCG tablet synthroid  75 mcg tabs    . losartan-hydrochlorothiazide (HYZAAR) 50-12.5 MG tablet Take 1 tablet by mouth daily.    . pravastatin (PRAVACHOL) 80 MG tablet Take 80 mg by mouth daily.    Marland Kitchen  Turmeric 500 MG CAPS Take 1 capsule by mouth in the morning and at bedtime.     No current facility-administered medications for this visit.    Allergies as of 08/09/2020 - Review Complete 08/09/2020  Allergen Reaction Noted  . Amoxicillin      Vitals: BP 122/76 (BP Location: Right Arm, Patient Position: Sitting)   Pulse 75   Ht 5\' 4"  (1.626 m)   Wt 148 lb (67.1 kg)   BMI 25.40 kg/m  Last Weight:  Wt Readings from Last 1 Encounters:  08/09/20 148 lb (67.1 kg)   Last Height:   Ht Readings from Last 1 Encounters:  08/09/20 5\' 4"  (1.626 m)     Physical exam: Exam: Gen: NAD, conversant, well nourised, well groomed                     CV: RRR, no MRG. No Carotid Bruits. No peripheral edema, warm, nontender Eyes: Conjunctivae clear without exudates or  hemorrhage  Neuro: Detailed Neurologic Exam  Speech:    Speech is normal; fluent and spontaneous with normal comprehension.  Cognition:    The patient is oriented to person, place, and time;     recent and remote memory intact;     language fluent;     normal attention, concentration,     fund of knowledge Cranial Nerves:    The pupils are equal, round, and reactive to light.Pupils too small to visualize fundi. Visual fields are full to finger confrontation. Extraocular movements are intact. Trigeminal sensation is intact and the muscles of mastication are normal. The face is symmetric. The palate elevates in the midline. Hearing intact. Voice is normal. Shoulder shrug is normal. The tongue has normal motion without fasciculations.   Coordination:    No dymsetria or ataxia   Gait:    Normal stride and arm swing, not parkinsonian  Motor Observation:    No asymmetry, no atrophy, and no involuntary movements noted. Tone:    Normal muscle tone.    Posture:    Posture is normal. normal erect    Strength:    Strength is V/V in the upper and lower limbs.      Sensation: intact to LT     Reflex Exam:  DTR's:    Deep tendon reflexes in the upper and lower extremities are symmetrical bilaterally.   Toes:    The toes are downgoing bilaterally.   Clonus:    Clonus is absent.    Assessment/Plan:  Absolutely lovely 75 year old patient here for subjective memory concerns, anosmia and hearing loss. MRI brain did not show anything concerning. I do not appreciate any degenerative neurocognitive concerns with her, she looks great. As far as anosmia goes, it can precede Parkinson' disease but other etiologies of olfactory loss include postviral upper respiratory infection (URI), sinonasal disease, head trauma, aging, congenital causes, nasal inflammation. I reasured patient. She can return to clinic anytime. We discussed further testing. If she feels symptoms progress, we can repeat MRI  brain and order formal memory testing. But at this time I don;t think it is necessary. Recommended "The XX Brain" book and The MIND diet [employer]).  Orders Placed This Encounter  Procedures  . B12 and Folate Panel  . Methylmalonic acid, serum  . Vitamin B1  . Homocysteine   No orders of the defined types were placed in this encounter.   Cc: Hayden Rasmussen, MD,  Hayden Rasmussen, MD  Sarina Ill, MD  [facility] [address] [place], [place] [address]  Phone [phone] Fax [phone]

## 2020-08-09 ENCOUNTER — Ambulatory Visit (INDEPENDENT_AMBULATORY_CARE_PROVIDER_SITE_OTHER): Payer: Medicare Other | Admitting: Neurology

## 2020-08-09 ENCOUNTER — Encounter: Payer: Self-pay | Admitting: *Deleted

## 2020-08-09 ENCOUNTER — Encounter: Payer: Self-pay | Admitting: Neurology

## 2020-08-09 ENCOUNTER — Other Ambulatory Visit: Payer: Self-pay

## 2020-08-09 VITALS — BP 122/76 | HR 75 | Ht 64.0 in | Wt 148.0 lb

## 2020-08-09 DIAGNOSIS — R4189 Other symptoms and signs involving cognitive functions and awareness: Secondary | ICD-10-CM

## 2020-08-09 NOTE — Patient Instructions (Signed)
Dr. Brett Fairy - sleep doctor The XX Brain The MIND diet [facility])  Recommendations to prevent or slow progression of cognitive decline:   Exercise You should increase exercise 30 to 45 minutes per day at least 3 days a week although 5 to 7 would be preferred. Any type of exercise (including walking) is acceptable although a recumbent bicycle may be best if you are unsteady. Disease related apathy can be a significant roadblock to exercise and the only way to overcome this is to make it a daily routine and perhaps have a reward at the end (something your loved one loves to eat or drink perhaps) or a personal trainer coming to the home can also be very useful. In general a structured, repetitive schedule is best.   Cardiovascular Health: You should optimize all cardiovascular risk factors (blood pressure, sugar, cholesterol) as vascular disease such as strokes and heart attacks can make memory problems much worse.   Diet: Eating a heart healthy (Mediterranean) diet is also a good idea; fish and poultry instead of red meat, nuts (mostly non-peanuts), vegetables, fruits, olive oil or canola oil (instead of butter), minimal salt (use other spices to flavor foods), whole grain rice, bread, cereal and pasta and wine in moderation.  General Health: Any diseases which effect your body will effect your brain such as a pneumonia, urinary infection, blood clot, heart attack or stroke. Keep contact with your primary care doctor for regular follow ups.  Sleep. A good nights sleep is healthy for the brain. Seven hours is recommended. If you have insomnia or poor sleep habits see the recommendations below  Tips: Structured and consistent daytime and nighttime routine, including regular wake times, bedtimes, and mealtimes, will be important for the patient to avoid confusion. Keeping frequently used items in designated places will help reduce stress from searching. If there are worries about getting lost do  not let the patient leave home unaccompanied. They might benefit from wearing an identification bracelet that will help others assist in finding home if they become lost. Information about nationwide safe return services and other helpful resources may be obtained through the Alzheimer's Association helpline at [phone].  Finances, Power of Producer, television/film/video Directives: You should consider putting legal safeguards in place with regard to financial and medical decision making. While the spouse always has power of attorney for medical and financial issues in the absence of any form, you should consider what you want in case the spouse / caregiver is no longer around or capable of making decisions.   [place] : http://www.welch.com/.pdf  Or Google "[place]" AND "An Forensic scientist for [employer]  Other States: ApartmentMom.com.ee  The signature on these forms should be notarized.   DRIVING:   Driving only during the day Drive only to familiar Locations Avoid driving during bad weather  If you would like to be tested to see if you are driving safely, [facility] has a Clinical Driving Evaluation. To schedule an appointment call [phone].                RESOURCES:  Memory Loss: Improve your short term memory By Silvio Pate  The Alzheimer's Reading Room http://www.alzheimersreadingroom.com/   The Alzheimer's Compendium http://www.alzcompend.info/  [employer] www.dukefamilysupport.QZE ([phone]) [phone]  Recommended resources for caregivers (All can be purchased on [employer]):  1) A Caregiver's Guide to Dementia: Using Activities and Other Strategies to Prevent, Reduce and Manage Behavioral Symptoms by Osie Bond. Tyler Aas and [employer]   2) A  Caregiver's Guide to Lewy Body Dementia by Caleen Essex MS BSN  and Gaston Islam   3) What If It's Not Alzheimer's?: A Caregiver's Guide to Dementia by Koren Shiver (Author), Octaviano Batty (Editor)  3) The 36 hour day by Rabins and Mace  4) Understanding Difficult Behaviors by Merita Norton and White  Online course for helping caregivers reduce stress, guilt and frustration called the Caregivers Helpbook. The website is www.powerfultoolsforcaregivers.org  As a caregiver you are a Art gallery manager. Problems you face as a caregiver are usually unique to your situation and the way your loved-one's disease manifests itself. The best way to use these books is to look at the Table of Contents and read any chapters of interest or that apply to challenges you are having as a caregiver.  NATIONAL RESOURCES: For more information on neurological disorders or research programs funded by the [employer] of Neurological Disorders and Stroke, contact the Institute's Agricultural consultant (BRAIN) at: [place] [address] [phone] (toll-free) MasterBoxes.it  Information on dementia is also available from the following organizations: Alzheimer's Disease Education and Referral ([place]) [place] [facility] A[facility] [address] [place] [address] [phone] (toll-free) DVDEnthusiasts.nl  Alzheimer's Association [address] [place] [address] [phone] (toll-free, 24-hour helpline) [phone] (TDD) CapitalMile.co.nz  Alzheimer's Foundation of America [address], [place] [address] [phone] (toll-free) www.alzfdn.org  [facility] [address] [place] [address] [phone] www.alzdiscovery.org  Association for [place] [address], [place] [address] [phone] (toll-free) www.theaftd[place] [address] ([phone] (toll-free) www.brightfocus.org/alzheimers  Doran Stabler [facility] Alzheimer's [facility] [address] [place] [address] [phone] www.https://lambert-jackson.net/  Lewy Body Dementia Association [address] [place] [address] [phone] (toll-free LBD Caregiver Link) www.lbda.[place], [place], [place] [address] ([phone] (toll-free) [phone] [facility]) https://carter.com/  National Organization for Rare Disorders [address] [place] [address] 7-517-001-VCBS [phone]) (toll-free) www.rarediseases.org  The Dementias: Hope Through Research was jointly produced by the [employer] of Neurological Disorders and Stroke (NINDS) and the [employer] on Aging (NIA), both part of the [employer], the [employer] research agency-supporting scientific studies that turn discovery into health. NINDS is the nation's leading funder of research on the brain and nervous system. The NINDS mission is to reduce the burden of neurological disease. For more information and resources, visit MasterBoxes.it [1] or call (573) [phone]. NIA leads the federal government effort conducting and supporting research on aging and the health and well-being of older people. NIA's Alzheimer's Disease Education and Referral (ADEAR) Center offers information and publications on dementia and caregiving for families, caregivers, and professionals. For more information, visit DVDEnthusiasts.nl [2] or call [phone]. Also available from NIA are publications and information about Alzheimer's disease as well as the booklets Frontotemporal Disorders: Information for Patients, Families, and Caregivers and Lewy Body Dementia: Information for Patients, Families, and Professionals. Source  URL: SocialSpecialists.co.nz

## 2020-08-15 ENCOUNTER — Telehealth: Payer: Self-pay | Admitting: Neurology

## 2020-08-15 ENCOUNTER — Other Ambulatory Visit: Payer: Self-pay | Admitting: Endocrinology

## 2020-08-15 DIAGNOSIS — E010 Iodine-deficiency related diffuse (endemic) goiter: Secondary | ICD-10-CM

## 2020-08-15 HISTORY — PX: BREAST BIOPSY: SHX20

## 2020-08-15 NOTE — Telephone Encounter (Signed)
Pt called, I was reviewing the paperwork given to me after my office visit. On the paperwork it states B12 and Folate, methylmalonic acid, serum, Vitamin B1, Homocysteine was done. Was there blood work suppose to be done at my office on 08/09/20 or do I need to come back for blood work? Would like a call from the nurse.

## 2020-08-16 NOTE — Telephone Encounter (Signed)
Spoke with pt, she states she will come in tomorrow to have labs done. Informed her of office hours and lab lunch time.

## 2020-08-16 NOTE — Telephone Encounter (Signed)
Dr Jaynee Eagles had ordered labs for the patient. Will see if she come during office hours to have these completed.

## 2020-08-21 ENCOUNTER — Encounter: Payer: Self-pay | Admitting: Genetic Counselor

## 2020-08-21 ENCOUNTER — Inpatient Hospital Stay: Payer: Medicare Other | Attending: Genetic Counselor | Admitting: Genetic Counselor

## 2020-08-21 ENCOUNTER — Other Ambulatory Visit: Payer: Self-pay

## 2020-08-21 ENCOUNTER — Inpatient Hospital Stay: Payer: Medicare Other

## 2020-08-21 DIAGNOSIS — Z8049 Family history of malignant neoplasm of other genital organs: Secondary | ICD-10-CM

## 2020-08-21 DIAGNOSIS — E039 Hypothyroidism, unspecified: Secondary | ICD-10-CM | POA: Insufficient documentation

## 2020-08-21 DIAGNOSIS — M810 Age-related osteoporosis without current pathological fracture: Secondary | ICD-10-CM | POA: Insufficient documentation

## 2020-08-21 DIAGNOSIS — Z803 Family history of malignant neoplasm of breast: Secondary | ICD-10-CM | POA: Insufficient documentation

## 2020-08-21 DIAGNOSIS — Z853 Personal history of malignant neoplasm of breast: Secondary | ICD-10-CM | POA: Diagnosis not present

## 2020-08-21 DIAGNOSIS — I10 Essential (primary) hypertension: Secondary | ICD-10-CM | POA: Insufficient documentation

## 2020-08-21 DIAGNOSIS — Z800 Family history of malignant neoplasm of digestive organs: Secondary | ICD-10-CM | POA: Insufficient documentation

## 2020-08-21 DIAGNOSIS — Z79899 Other long term (current) drug therapy: Secondary | ICD-10-CM | POA: Insufficient documentation

## 2020-08-21 DIAGNOSIS — Z1379 Encounter for other screening for genetic and chromosomal anomalies: Secondary | ICD-10-CM

## 2020-08-21 DIAGNOSIS — E785 Hyperlipidemia, unspecified: Secondary | ICD-10-CM | POA: Insufficient documentation

## 2020-08-21 DIAGNOSIS — Z170 Estrogen receptor positive status [ER+]: Secondary | ICD-10-CM | POA: Insufficient documentation

## 2020-08-21 DIAGNOSIS — C50412 Malignant neoplasm of upper-outer quadrant of left female breast: Secondary | ICD-10-CM | POA: Insufficient documentation

## 2020-08-21 DIAGNOSIS — Z8041 Family history of malignant neoplasm of ovary: Secondary | ICD-10-CM | POA: Insufficient documentation

## 2020-08-21 HISTORY — DX: Family history of malignant neoplasm of other genital organs: Z80.49

## 2020-08-21 HISTORY — DX: Family history of malignant neoplasm of breast: Z80.3

## 2020-08-21 HISTORY — DX: Personal history of malignant neoplasm of breast: Z85.3

## 2020-08-21 HISTORY — DX: Family history of malignant neoplasm of digestive organs: Z80.0

## 2020-08-21 NOTE — Progress Notes (Signed)
REFERRING PROVIDER: No referring provider defined for this encounter.  PRIMARY PROVIDER:  Hayden Rasmussen, MD  PRIMARY REASON FOR VISIT:  1. Personal history of breast cancer   2. Family history of breast cancer   3. Family history of colon cancer   4. Family history of pancreatic cancer   5. Family history of uterine cancer    HISTORY OF PRESENT ILLNESS:   Sandra Galloway, a 75 y.o. female, was seen for a [facility] cancer genetics consultation due to a personal and family history of cancer.  Sandra Galloway presents to clinic today to discuss the possibility of a hereditary predisposition to cancer, to discuss genetic testing, and to further clarify her future cancer risks, as well as potential cancer risks for family members.   At the age of 52, Sandra Galloway was diagnosed with cancer right breast (ER+). The treatment plan included lumpectomy, adjuvant chemotherapy, adjuvant radiation, and anti-estrogens.  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  RISK FACTORS:  Menarche was at age 81.  First live birth at age 81.  OCP use for approximately 3 months  Ovaries intact: yes.  Hysterectomy: no.  Menopausal status: postmenopausal.  HRT use: 0 years. Colonoscopy: yes; tubular adenoma detected in 2016. Mammogram within the last year: yes. Number of breast biopsies: 1; breast biopsy scheduled for 09/04/2020 for mass detected in right breast Up to date with pelvic exams: most recent PAP in 2017.   Past Medical History:  Diagnosis Date  . Allergic rhinitis   . Basal cell carcinoma (BCC) of skin of nose   . Cancer ([place])    right breast  . Effusion, right knee   . Essential (primary) hypertension   . Family history of breast cancer 08/21/2020  . Family history of colon cancer 08/21/2020  . Family history of pancreatic cancer 08/21/2020  . Family history of uterine cancer 08/21/2020  . Hyperlipidemia   . Hypothyroidism   . Personal history of  breast cancer 08/21/2020  . Varicose veins     Past Surgical History:  Procedure Laterality Date  . BREAST LUMPECTOMY  1996   right breast  . ENDOVENOUS ABLATION SAPHENOUS VEIN W/ LASER Left 07-22-2013   left greater saphenous vein and sclerotherapy (left leg)  Curt Jews MD   . ENDOVENOUS ABLATION SAPHENOUS VEIN W/ LASER Right 09-02-2013   right greater saphenous vein and sclerotherapy right leg  by Curt Jews MD  . TUBAL LIGATION  1984    Social History   Socioeconomic History  . Marital status: Married    Spouse name: Not on file  . Number of children: 3  . Years of education: 82  . Highest education level: Some college, no degree  Occupational History  . Occupation: retired  Tobacco Use  . Smoking status: Never Smoker  . Smokeless tobacco: Never Used  Vaping Use  . Vaping Use: Never used  Substance and Sexual Activity  . Alcohol use: Yes; approx 1 drink every 2 weeks  . Drug use: No  . Sexual activity: Not on file  Other Topics Concern  . Not on file  Social History Narrative   Lives at home with husband in a one story home.  Has 3 children.  Retired from [employer].  Education: some college.     Right handed   Caffeine: 2 cups/day, was more   Social Determinants of Health   Financial Resource Strain: Not on  file  Food Insecurity: Not on file  Transportation Needs: Not on file  Physical Activity: Not on file  Stress: Not on file  Social Connections: Not on file     FAMILY HISTORY:  We obtained a detailed, 4-generation family history.  Significant diagnoses are listed below: Family History  Problem Relation Age of Onset  . Colon cancer Mother 40  . Cancer Father        unknown primary; ? pancreatic  . Skin cancer Father   . Breast cancer Sister        Ages 36, 26 (two primaries)  . Endometrial cancer Maternal Aunt 35  . Ovarian cancer Paternal Aunt 78  . Breast cancer Cousin 40  . Cervical cancer Daughter 18  . Ovarian  cancer Paternal Aunt 78  . Pancreatic cancer Paternal Aunt 89  . Breast cancer Cousin 17     Sandra Galloway has three daughters.  One daughter, age 71, was diagnosed with cervical cancer at age 76.  Sandra Galloway has one brother and one sister.  Her sister had bilateral breast cancer and recently had negative panel genetic testing.  Sandra Galloway brother has a history of colon polyps but no cancer.   Sandra Galloway mother was diagnosed with colon cancer at age 82 and died at age 59.  Sandra Galloway also has a maternal uncle with colon cancer diagnosed at age 73 and a maternal aunt with uterine cancer diagnosed at age 59.  Sandra Galloway has a maternal cousin diagnosed with breast cancer at age 3.   Sandra Galloway father died from an unknown cancer, possibly pancreas, at age 35.  Sandra Galloway had two paternal aunts with ovarian cancer, a paternal aunt with pancreatic cancer, and a paternal cousin with breast cancer in her 32s.    Sandra Galloway is unaware of previous family history of genetic testing for hereditary cancer risks besides that mentioned above. Patient's maternal ancestors are of [place] descent, and paternal ancestors are of [place] descent. There is no reported Ashkenazi Jewish ancestry. There is no known consanguinity.  GENETIC COUNSELING ASSESSMENT: Sandra Galloway is a 75 y.o. female with a personal and family history of cancer which is somewhat suggestive of a hereditary cancer syndrome and predisposition to cancer given the presence of related cancers in the maternal and paternal family. We, therefore, discussed and recommended the following at today's visit.   DISCUSSION: We discussed that 5 - 10% of cancer is hereditary, with most cases of hereditary breast cancer associated with mutations in BRCA1/2.  There are other genes that can be associated with hereditary breast cancer syndromes.  These include but are not limited to Phillips, ATM, and CHEK2.  There are also other genes associated with hereditary colon and  endometrial cancers, such as the the Lynch syndrome genes, and hereditary ovarian and pancreatic cancer.  Type of cancer risk and level of risk are gene-specific. We discussed that testing is beneficial for several reasons including knowing how to follow individuals for their cancer risks and understanding if other family members could be at risk for cancer and allowing them to undergo genetic testing.   We reviewed the characteristics, features and inheritance patterns of hereditary cancer syndromes. We also discussed genetic testing, including the appropriate family members to test, the process of testing, insurance coverage and turn-around-time for results. We discussed the implications of a negative, positive, carrier and/or variant of uncertain significant result. We recommended Sandra Galloway pursue genetic testing for a panel that includes genes associated  with breast, ovarian, pancreatic, and colon cancer.   Sandra Galloway  was offered a common hereditary cancer panel (47 genes) and an expanded pan-cancer panel (77 genes). Sandra Galloway was informed of the benefits and limitations of each panel, including that expanded pan-cancer panels contain genes that do not have clear management guidelines at this point in time.  We also discussed that as the number of genes included on a panel increases, the chances of variants of uncertain significance increases.  After considering the benefits and limitations of each gene panel, Sandra Galloway  elected to have an expanded pan-cancer panel through [place].  The CancerNext-Expanded gene panel offered by [facility] and includes sequencing, rearrangement, and RNA analysis for the following 77 genes: AIP, ALK, APC, ATM, AXIN2, BAP1, BARD1, BLM, BMPR1A, BRCA1, BRCA2, BRIP1, CDC73, CDH1, CDK4, CDKN1B, CDKN2A, CHEK2, CTNNA1, DICER1, FANCC, FH, FLCN, GALNT12, KIF1B, LZTR1, MAX, MEN1, MET, MLH1, MSH2, MSH3, MSH6, MUTYH, NBN, NF1, NF2, NTHL1, PALB2, PHOX2B, PMS2, POT1, PRKAR1A, PTCH1,  PTEN, RAD51C, RAD51D, RB1, RECQL, RET, SDHA, SDHAF2, SDHB, SDHC, SDHD, SMAD4, SMARCA4, SMARCB1, SMARCE1, STK11, SUFU, TMEM127, TP53, TSC1, TSC2, VHL and XRCC2 (sequencing and deletion/duplication); EGFR, EGLN1, HOXB13, KIT, MITF, PDGFRA, POLD1, and POLE (sequencing only); EPCAM and GREM1 (deletion/duplication only).   Based on Ms. Huizinga's personal and family history of cancer, she meets medical criteria for genetic testing. Despite that she meets criteria, she may still have an out of pocket cost. We discussed that if her out of pocket cost for testing is over $100, the laboratory will contact her to discuss self-pay and patient pay assistance programs.   PLAN: After considering the risks, benefits, and limitations, Sandra Galloway provided informed consent to pursue genetic testing and the blood sample was sent to [facility] for analysis of the CancerNext-Expanded+RNAinsight. Results should be available within approximately 3 weeks' time, at which point they will be disclosed by telephone to Sandra Galloway, as will any additional recommendations warranted by these results. Sandra Galloway will receive a summary of her genetic counseling visit and a copy of her results once available. This information will also be available in Epic.   Lastly, we encouraged Sandra Galloway to remain in contact with cancer genetics annually so that we can continuously update the family history and inform her of any changes in cancer genetics and testing that may be of benefit for this family.   Sandra Galloway questions were answered to her satisfaction today. Our contact information was provided should additional questions or concerns arise. Thank you for the referral and allowing [place] to share in the care of your patient.   Catha Ontko M. Joette Catching, [place], [facility] Genetic Counselor [email or web address] (P) [phone]  The patient was seen for a total of 35 minutes in face-to-face genetic counseling.  Drs. Magrinat, Lindi Adie and/or Burr Medico were  available to discuss this case as needed.    _______________________________________________________________________ For Office Staff:  Number of people involved in session: 1 Was an Intern/ student involved with case: no

## 2020-08-24 ENCOUNTER — Ambulatory Visit
Admission: RE | Admit: 2020-08-24 | Discharge: 2020-08-24 | Disposition: A | Discharge status: Home / Self Care | Payer: Medicare Other | Source: Ambulatory Visit | Attending: Endocrinology | Admitting: Endocrinology

## 2020-08-24 ENCOUNTER — Other Ambulatory Visit: Payer: Medicare Other

## 2020-08-24 ENCOUNTER — Other Ambulatory Visit: Payer: Self-pay

## 2020-08-24 DIAGNOSIS — E010 Iodine-deficiency related diffuse (endemic) goiter: Secondary | ICD-10-CM

## 2020-08-31 ENCOUNTER — Encounter: Payer: Self-pay | Admitting: Gastroenterology

## 2020-09-04 ENCOUNTER — Other Ambulatory Visit: Payer: Self-pay | Admitting: Radiology

## 2020-09-05 ENCOUNTER — Telehealth: Payer: Self-pay | Admitting: Oncology

## 2020-09-05 NOTE — Telephone Encounter (Signed)
Spoke to patient to confirm morning [facility] appointment for 3/30, [facility] will send packet

## 2020-09-06 ENCOUNTER — Encounter: Payer: Self-pay | Admitting: Family Medicine

## 2020-09-07 ENCOUNTER — Encounter: Payer: Self-pay | Admitting: *Deleted

## 2020-09-11 ENCOUNTER — Encounter: Payer: Self-pay | Admitting: *Deleted

## 2020-09-11 DIAGNOSIS — Z170 Estrogen receptor positive status [ER+]: Secondary | ICD-10-CM

## 2020-09-11 DIAGNOSIS — C50412 Malignant neoplasm of upper-outer quadrant of left female breast: Secondary | ICD-10-CM

## 2020-09-12 ENCOUNTER — Telehealth: Payer: Self-pay | Admitting: Genetic Counselor

## 2020-09-12 ENCOUNTER — Encounter: Payer: Self-pay | Admitting: Genetic Counselor

## 2020-09-12 DIAGNOSIS — Z1379 Encounter for other screening for genetic and chromosomal anomalies: Secondary | ICD-10-CM | POA: Insufficient documentation

## 2020-09-12 DIAGNOSIS — C50912 Malignant neoplasm of unspecified site of left female breast: Secondary | ICD-10-CM | POA: Insufficient documentation

## 2020-09-12 DIAGNOSIS — M810 Age-related osteoporosis without current pathological fracture: Secondary | ICD-10-CM | POA: Insufficient documentation

## 2020-09-12 NOTE — Progress Notes (Signed)
[place]  Telephone:(336) [phone] Fax:(336) [phone]     ID: PANSEY PINHEIRO DOB: 11/07/1945  MR#: [id number]  WCB#:[id number]  Patient Care Team: Hayden Rasmussen, MD as PCP - General (Family Medicine) Mauro Kaufmann, RN as Oncology Nurse Navigator Rockwell Germany, RN as Oncology Nurse Navigator Erroll Luna, MD as Consulting Physician (General Surgery) Jessic Standifer, Virgie Dad, MD as Consulting Physician (Oncology) Eppie Gibson, MD as Attending Physician (Radiation Oncology) Melvenia Beam, MD as Consulting Physician (Neurology) Keene Breath., MD (Ophthalmology) Chauncey Cruel, MD OTHER MD:  CHIEF COMPLAINT: New breast cancer in previously irradiated breast  CURRENT TREATMENT: Definitive surgery pending   HISTORY OF CURRENT ILLNESS: ALILAH MCMEANS has a history of left breast cancer in 1996. She was treated by Dr. Malachy Mood with lumpectomy, axillary lymph node dissection (3 out of 10 removed lymph nodes were positive), chemotherapy (likely cyclophosphamide and doxorubicin every 3 weeks x 6 from her description), followed by radiation and then tamoxifen for 5 years.  She had routine screening mammography on 08/07/2020 showing a possible abnormality in the left breast. She underwent left diagnostic mammography with tomography at [facility] on 08/14/2020 showing: breast density category B; 0.5 cm grouped punctate calcifications in upper-outer left breast.  Accordingly on 09/04/2020 she proceeded to biopsy of the left breast area in question. The pathology from this procedure ([id number]) showed: invasive ductal carcinoma, three foci, grade 1; ductal carcinoma in situ. Prognostic indicators significant for: estrogen receptor, 100% positive with strong staining intensity and progesterone receptor, 0% negative. Proliferation marker Ki67 at 2%. HER2 negative by immunohistochemistry (0).  Cancer Staging Malignant neoplasm of upper-outer quadrant of left breast in female, estrogen  receptor positive ([place]) Staging form: Breast, AJCC 8th Edition - Clinical: Stage IA (cT1a, cN0, cM0, G1, ER+, PR-, HER2-) - Unsigned Stage prefix: Initial diagnosis Histologic grading system: 3 grade system   The patient's subsequent history is as detailed below.   INTERVAL HISTORY: Jamekia was evaluated in the multidisciplinary breast cancer clinic on 09/13/2020 accompanied by her husband of 7 months, Chitara Clonch. Her case was also presented at the multidisciplinary breast cancer conference on the same day. At that time a preliminary plan was proposed: Mastectomy versus lumpectomy, antiestrogens  She underwent genetic counseling on 08/21/2020.  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   REVIEW OF SYSTEMS: There were no specific symptoms leading to the original mammogram, which was routinely scheduled. On the provided questionnaire, Zakiya reports wearing glasses, hearing loss requiring a hearing aid, poor circulation in her legs, history of skin cancer, arthritis, thyroid nodules, and history of chemotherapy. The patient denies unusual headaches, visual changes, nausea, vomiting, stiff neck, dizziness, or gait imbalance. There has been no cough, phlegm production, or pleurisy, no chest pain or pressure, and no change in bowel or bladder habits. The patient denies fever, rash, bleeding, unexplained fatigue or unexplained weight loss. A detailed review of systems was otherwise entirely negative.   COVID 19 VACCINATION STATUS: fully vaccinated [employer]), with booster 04/2020   PAST MEDICAL HISTORY: Past Medical History:  Diagnosis Date  . Allergic rhinitis   . Basal cell carcinoma (BCC) of skin of nose   . Breast cancer ([place])   . Cancer ([place])    right breast  . Effusion, right knee   . Essential (primary) hypertension   . Family history of breast cancer 08/21/2020  . Family history of colon cancer 08/21/2020  . Family history of pancreatic cancer 08/21/2020  . Family history of  uterine cancer  08/21/2020  . Hyperlipidemia   . Hypothyroidism   . 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 Personal history of breast cancer 08/21/2020  . Varicose veins     PAST SURGICAL HISTORY: Past Surgical History:  Procedure Laterality Date  . BREAST LUMPECTOMY  1996   right breast  . ENDOVENOUS ABLATION SAPHENOUS VEIN W/ LASER Left 07-22-2013   left greater saphenous vein and sclerotherapy (left leg)  Curt Jews MD   . ENDOVENOUS ABLATION SAPHENOUS VEIN W/ LASER Right 09-02-2013   right greater saphenous vein and sclerotherapy right leg  by Curt Jews MD  . TUBAL LIGATION  1984    FAMILY HISTORY: Family History  Problem Relation Age of Onset  . Other Mother        varicose veins  . Colon cancer Mother 5  . Dementia Mother   . Heart disease Father   . Hyperlipidemia Father   . Heart attack Father   . Cancer Father        unknown primary; ? pancreatic  . Skin cancer Father   . Breast cancer Sister        Ages 80, 23 (two primaries)  . Endometrial cancer Maternal Aunt 35  . Ovarian cancer Paternal Aunt 78  . Ovarian cysts Paternal Aunt   . Cancer - Ovarian Paternal Aunt   . Breast cancer Cousin 72  . Cervical cancer Daughter 53  . Ovarian cancer Paternal Aunt 78  . Pancreatic cancer Paternal Aunt 89  . Breast cancer Cousin 6   Her father died at age 61 from pancreatic cancer. Her mother died at age 75 and had a history of colon cancer at age 64. Jennfier has 2 brothers and 2 sisters. She also reports breast cancer in a sister (Bev, who is also my patient) at age 66 with recurrence at age 62, ovarian cancer in two paternal aunts, cervical cancer in her daughter at age 69, and an unknown cancer in a maternal aunt.   GYNECOLOGIC HISTORY:  No LMP recorded. Patient is postmenopausal. Menarche: 75 years old Age at first live birth: 75 years old [place] P 3 LMP 64 Contraceptive never used HRT never used  Hysterectomy? no BSO? no   SOCIAL HISTORY: (updated 08/2020)  Ermelinda is  currently retired from working as a Education officer, museum. She is recently married! Husband Mckenzy Salazar is retired from working as Development worker, international aid of the career center at [employer] of [place]. Daughter Otelia Limes, age 53, is the owner of a [employer] and lives in [place]. Daughter Jethro Bastos, age 23, is a Ship broker" and lives in [place]. Daughter Crystalee Ventress, age 25, is a Education officer, museum in [place]. The patient has 8 grandchildren. She attends [employer]).    ADVANCED DIRECTIVES: in place   HEALTH MAINTENANCE: Social History   Tobacco Use  . Smoking status: Never Smoker  . Smokeless tobacco: Never Used  Vaping Use  . Vaping Use: Never used  Substance Use Topics  . Alcohol use: Yes    Alcohol/week: 1.0 standard drink    Types: 1 Glasses of wine per week  . Drug use: No     Colonoscopy: 05/2015 (Dr. Silverio Decamp), recall due 2021  PAP: 2020  Bone density: 08/2020   Allergies  Allergen Reactions  . Amoxicillin     REACTION: rash    Current Outpatient Medications  Medication Sig Dispense Refill  . ergocalciferol (VITAMIN D2) 1.25 MG (50000 UT)  capsule Take 50,000 Units by mouth once a week.    . levothyroxine (SYNTHROID) 75 MCG tablet synthroid  75 mcg tabs    . losartan-hydrochlorothiazide (HYZAAR) 50-12.5 MG tablet Take 1 tablet by mouth daily.    . pravastatin (PRAVACHOL) 80 MG tablet Take 80 mg by mouth daily.    . Turmeric 500 MG CAPS Take 1 capsule by mouth in the morning and at bedtime.     No current facility-administered medications for this visit.    OBJECTIVE: White woman who appears younger than stated  Vitals:   09/13/20 0904  BP: (!) 141/91  Pulse: 76  Resp: 18  Temp: 98.1 F (36.7 C)  SpO2: 99%     Body mass index is 25.32 kg/m.   Wt Readings from Last 3 Encounters:  09/13/20 147 lb 8 oz (66.9 kg)  08/09/20 148 lb (67.1 kg)  02/11/19 145 lb (65.8 kg)      ECOG FS:1 - Symptomatic but completely  ambulatory  Ocular: Sclerae unicteric, pupils round and equal Ear-nose-throat: Wearing a mask Lymphatic: No cervical or supraclavicular adenopathy Lungs no rales or rhonchi Heart regular rate and rhythm Abd soft, nontender, positive bowel sounds MSK no focal spinal tenderness, no joint edema Neuro: non-focal, well-oriented, appropriate affect Breasts: The right breast is unremarkable.  The left breast is status post remote lumpectomy and radiation as well as axillary lymph node dissection.  It is status post recent biopsy with a small ecchymosis.  Both axillas are benign   LAB RESULTS:  CMP     Component Value Date/Time   NA 140 09/13/2020 0833   K 4.1 09/13/2020 0833   CL 105 09/13/2020 0833   CO2 26 09/13/2020 0833   GLUCOSE 89 09/13/2020 0833   BUN 16 09/13/2020 0833   CREATININE 0.79 09/13/2020 0833   CALCIUM 8.7 (L) 09/13/2020 0833   PROT 7.1 09/13/2020 0833   ALBUMIN 3.8 09/13/2020 0833   AST 17 09/13/2020 0833   ALT 11 09/13/2020 0833   ALKPHOS 79 09/13/2020 0833   BILITOT 0.3 09/13/2020 0833   GFRNONAA >60 09/13/2020 0833    No results found for: TOTALPROTELP, ALBUMINELP, A1GS, A2GS, BETS, BETA2SER, GAMS, MSPIKE, SPEI  Lab Results  Component Value Date   WBC 5.0 09/13/2020   NEUTROABS 2.8 09/13/2020   HGB 11.8 (L) 09/13/2020   HCT 36.5 09/13/2020   MCV 91.9 09/13/2020   PLT 324 09/13/2020    No results found for: LABCA2  No components found for: [id number]  No results for input(s): INR in the last 168 hours.  No results found for: LABCA2  No results found for: TWK462  No results found for: MMN817  No results found for: RNH657  No results found for: CA2729  No components found for: HGQUANT  No results found for: CEA1 / No results found for: CEA1   No results found for: AFPTUMOR  No results found for: CHROMOGRNA  No results found for: KPAFRELGTCHN, LAMBDASER, KAPLAMBRATIO (kappa/lambda light chains)  No results found for: HGBA,  HGBA2QUANT, HGBFQUANT, HGBSQUAN (Hemoglobinopathy evaluation)   No results found for: LDH  No results found for: IRON, TIBC, IRONPCTSAT (Iron and TIBC)  No results found for: FERRITIN  Urinalysis No results found for: COLORURINE, APPEARANCEUR, LABSPEC, PHURINE, GLUCOSEU, HGBUR, BILIRUBINUR, KETONESUR, PROTEINUR, UROBILINOGEN, NITRITE, LEUKOCYTESUR   STUDIES: US THYROID  Result Date: 08/24/2020 CLINICAL DATA:  Goiter. EXAM: THYROID ULTRASOUND TECHNIQUE: Ultrasound examination of the thyroid gland and adjacent soft tissues was performed. COMPARISON:  08/27/2019 FINDINGS: Parenchymal  Echotexture: Moderately heterogenous Isthmus: 0.7 cm, previously 0.4 cm Right lobe: 4.2 x 1.3 x 1.2 cm, previously 4.3 x 1.1 x 1.3 cm Left lobe: 0.2 x 0.9 x 1.0 cm, previously 4.1 x 0.9 x 1.2 cm _________________________________________________________ Estimated total number of nodules >/= 1 cm: 2 Number of spongiform nodules >/=  2 cm not described below (TR1): 0 Number of mixed cystic and solid nodules >/= 1.5 cm not described below (TR2): 0 _________________________________________________________ Unchanged spongiform nodule in the superior, leftward aspect of the isthmus (labeled 1, 1.1 cm previously 1.6 cm). Similar appearance of the previously biopsied dominant isthmus solid thyroid nodule (labeled 2, 3.5 cm previously 3.4 cm). No new thyroid nodules. IMPRESSION: 1. Similar-appearing multinodular goiter. No new, worrisome thyroid nodules. 2. Unchanged appearance of the dominant isthmus solid thyroid nodule (labeled 2, 3.5 cm). Recommend correlation with prior biopsy results. The above is in keeping with the ACR TI-RADS recommendations - J Am Coll Radiol 2017;14:587-595. Ruthann Cancer, MD [facility] Electronically Signed   By: Ruthann Cancer MD   On: 08/24/2020 14:09     ELIGIBLE FOR AVAILABLE RESEARCH PROTOCOL: No  ASSESSMENT: 75 y.o. [place]  woman  (1) status post left lumpectomy and axillary lymph node dissection in 1996 for a [place] N1 invasive breast cancer, status post adjuvant chemotherapy  [most likely cyclophosphamide and doxorubicin every 3 weeks x 6], followed by adjuvant radiation, then tamoxifen for 5 years  (2) genetics testing 09/11/2020 through the [facility] Panel 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)   (a) no additional deleterious mutations noted in AIP, ALK, APC, ATM, AXIN2, BAP1, BARD1, BLM, BMPR1A, BRCA1, BRCA2, BRIP1, CDC73, CDH1, CDK4, CDKN1B, CDKN2A, CHEK2, CTNNA1, DICER1, FANCC, FH, FLCN, GALNT12, KIF1B, LZTR1, MAX, MEN1, MET, MLH1, MSH2, MSH3, MSH6, MUTYH, NBN, NF1, NF2, NTHL1, PALB2, PHOX2B, PMS2, POT1, PRKAR1A, PTCH1, PTEN, RAD51C, RAD51D, RB1, RECQL, RET, SDHA, SDHAF2, SDHB, SDHC, SDHD, SMAD4, SMARCA4, SMARCB1, SMARCE1, STK11, SUFU, TMEM127, TP53, TSC1, TSC2, VHL and XRCC2 (sequencing and deletion/duplication); EGFR, EGLN1, HOXB13, KIT, MITF, PDGFRA, POLD1, and POLE (sequencing only); EPCAM and GREM1 (deletion/duplication only).   (3) lumpectomy with no sentinel lymph node sampling pending  (4) to start anastrozole at the completion of local treatment  (a) osteoporosis   (b) alendronate started March 2022   PLAN: I met today with Akila to review her new diagnosis. Specifically we discussed the biology of her breast cancer, its diagnosis, staging, treatment  options and prognosis.  She understands that in previously irradiated breast, when a new cancer develops the standard of care is mastectomy.  This was discussed extensively at conference today.  Our feeling is that given the size of this new tumor and the very favorable phenotype as well as the patient's age lumpectomy with clear margins it is acceptable.  I discussed this with the patient in detail and she is in agreement with this plan  Once she completes her radiation she will return to see me and we  will start anastrozole, since she received tamoxifen previously.  There is a history of osteoporosis and she was just started 2 weeks ago on alendronate, which so far she is tolerating well.  The plan is to use anastrozole which can worsen osteoporosis issues, but generally if we use a bisphosphonate or denosumab that easily makes up for that effect, and I would expect after 2 years of treatment her bone density will be improved despite her being on anastrozole.  She understands the  mutation she carries is associated with increased risk of melanoma, renal cell cancer, and mesothelioma.  We will initiate screening for those increased risks at the time of the next visit.  She will return to see me in about 6 weeks to start treatment  Total encounter time 65 minutes.Sarajane Jews C. Meta Kroenke, MD 09/13/2020 12:43 PM Medical Oncology and Hematology [facility] [place], [place] [address] Tel. [phone]    Fax. [phone]   This document serves as a record of services personally performed by Lurline Del, MD. It was created on his behalf by Wilburn Mylar, a trained medical scribe. The creation of this record is based on the scribe's personal observations and the provider's statements to them.   I, Lurline Del MD, have reviewed the above documentation for accuracy and completeness, and I agree with the above.    *Total Encounter Time as defined by the Centers for Medicare and Medicaid Services includes, in addition to the face-to-face time of a patient visit (documented in the note above) non-face-to-face time: obtaining and reviewing outside history, ordering and reviewing medications, tests or procedures, care coordination (communications with other health care professionals or caregivers) and documentation in the medical record.

## 2020-09-12 NOTE — Telephone Encounter (Signed)
Contacted patient in attempt to disclose results of genetic testing.  LVM with contact information requesting a call back.  

## 2020-09-13 ENCOUNTER — Other Ambulatory Visit: Payer: Self-pay

## 2020-09-13 ENCOUNTER — Ambulatory Visit: Payer: Medicare Other | Admitting: Physical Therapy

## 2020-09-13 ENCOUNTER — Encounter: Payer: Self-pay | Admitting: *Deleted

## 2020-09-13 ENCOUNTER — Encounter: Payer: Self-pay | Admitting: Licensed Clinical Social Worker

## 2020-09-13 ENCOUNTER — Ambulatory Visit: Payer: Self-pay | Admitting: Surgery

## 2020-09-13 ENCOUNTER — Inpatient Hospital Stay (HOSPITAL_BASED_OUTPATIENT_CLINIC_OR_DEPARTMENT_OTHER): Payer: Medicare Other | Admitting: Oncology

## 2020-09-13 ENCOUNTER — Encounter: Payer: Self-pay | Admitting: Oncology

## 2020-09-13 ENCOUNTER — Encounter: Payer: Self-pay | Admitting: Radiation Oncology

## 2020-09-13 ENCOUNTER — Inpatient Hospital Stay: Payer: Medicare Other

## 2020-09-13 ENCOUNTER — Encounter: Payer: Self-pay | Admitting: Genetic Counselor

## 2020-09-13 ENCOUNTER — Ambulatory Visit (HOSPITAL_BASED_OUTPATIENT_CLINIC_OR_DEPARTMENT_OTHER): Payer: Medicare Other | Admitting: Genetic Counselor

## 2020-09-13 ENCOUNTER — Ambulatory Visit
Admission: RE | Admit: 2020-09-13 | Discharge: 2020-09-13 | Disposition: A | Discharge status: Home / Self Care | Payer: Medicare Other | Source: Ambulatory Visit | Attending: Radiation Oncology | Admitting: Radiation Oncology

## 2020-09-13 VITALS — BP 141/91 | HR 76 | Temp 98.1°F | Resp 18 | Ht 64.0 in | Wt 147.5 lb

## 2020-09-13 DIAGNOSIS — Z803 Family history of malignant neoplasm of breast: Secondary | ICD-10-CM | POA: Diagnosis not present

## 2020-09-13 DIAGNOSIS — Z1379 Encounter for other screening for genetic and chromosomal anomalies: Secondary | ICD-10-CM

## 2020-09-13 DIAGNOSIS — Z853 Personal history of malignant neoplasm of breast: Secondary | ICD-10-CM

## 2020-09-13 DIAGNOSIS — Z170 Estrogen receptor positive status [ER+]: Secondary | ICD-10-CM

## 2020-09-13 DIAGNOSIS — C50412 Malignant neoplasm of upper-outer quadrant of left female breast: Secondary | ICD-10-CM | POA: Diagnosis present

## 2020-09-13 DIAGNOSIS — Z800 Family history of malignant neoplasm of digestive organs: Secondary | ICD-10-CM | POA: Diagnosis not present

## 2020-09-13 DIAGNOSIS — Z79899 Other long term (current) drug therapy: Secondary | ICD-10-CM | POA: Diagnosis not present

## 2020-09-13 DIAGNOSIS — C50912 Malignant neoplasm of unspecified site of left female breast: Secondary | ICD-10-CM

## 2020-09-13 DIAGNOSIS — E039 Hypothyroidism, unspecified: Secondary | ICD-10-CM | POA: Diagnosis not present

## 2020-09-13 DIAGNOSIS — Z8049 Family history of malignant neoplasm of other genital organs: Secondary | ICD-10-CM

## 2020-09-13 DIAGNOSIS — I10 Essential (primary) hypertension: Secondary | ICD-10-CM | POA: Diagnosis not present

## 2020-09-13 DIAGNOSIS — Z8041 Family history of malignant neoplasm of ovary: Secondary | ICD-10-CM | POA: Diagnosis not present

## 2020-09-13 DIAGNOSIS — M810 Age-related osteoporosis without current pathological fracture: Secondary | ICD-10-CM | POA: Diagnosis not present

## 2020-09-13 DIAGNOSIS — E785 Hyperlipidemia, unspecified: Secondary | ICD-10-CM | POA: Diagnosis not present

## 2020-09-13 DIAGNOSIS — Z1509 Genetic susceptibility to other malignant neoplasm: Secondary | ICD-10-CM

## 2020-09-13 HISTORY — DX: Genetic susceptibility to other malignant neoplasm: Z15.09

## 2020-09-13 LAB — CBC WITH DIFFERENTIAL (CANCER CENTER ONLY)
Abs Immature Granulocytes: 0.01 10*3/uL (ref 0.00–0.07)
Basophils Absolute: 0.1 10*3/uL (ref 0.0–0.1)
Basophils Relative: 1 %
Eosinophils Absolute: 0.2 10*3/uL (ref 0.0–0.5)
Eosinophils Relative: 4 %
HCT: 36.5 % (ref 36.0–46.0)
Hemoglobin: 11.8 g/dL — ABNORMAL LOW (ref 12.0–15.0)
Immature Granulocytes: 0 %
Lymphocytes Relative: 26 %
Lymphs Abs: 1.3 10*3/uL (ref 0.7–4.0)
MCH: 29.7 pg (ref 26.0–34.0)
MCHC: 32.3 g/dL (ref 30.0–36.0)
MCV: 91.9 fL (ref 80.0–100.0)
Monocytes Absolute: 0.6 10*3/uL (ref 0.1–1.0)
Monocytes Relative: 12 %
Neutro Abs: 2.8 10*3/uL (ref 1.7–7.7)
Neutrophils Relative %: 57 %
Platelet Count: 324 10*3/uL (ref 150–400)
RBC: 3.97 MIL/uL (ref 3.87–5.11)
RDW: 13.4 % (ref 11.5–15.5)
WBC Count: 5 10*3/uL (ref 4.0–10.5)
nRBC: 0 % (ref 0.0–0.2)

## 2020-09-13 LAB — CMP (CANCER CENTER ONLY)
ALT: 11 U/L (ref 0–44)
AST: 17 U/L (ref 15–41)
Albumin: 3.8 g/dL (ref 3.5–5.0)
Alkaline Phosphatase: 79 U/L (ref 38–126)
Anion gap: 9 (ref 5–15)
BUN: 16 mg/dL (ref 8–23)
CO2: 26 mmol/L (ref 22–32)
Calcium: 8.7 mg/dL — ABNORMAL LOW (ref 8.9–10.3)
Chloride: 105 mmol/L (ref 98–111)
Creatinine: 0.79 mg/dL (ref 0.44–1.00)
GFR, Estimated: >60 mL/min (ref >60)
Glucose, Bld: 89 mg/dL (ref 70–99)
Potassium: 4.1 mmol/L (ref 3.5–5.1)
Sodium: 140 mmol/L (ref 135–145)
Total Bilirubin: 0.3 mg/dL (ref 0.3–1.2)
Total Protein: 7.1 g/dL (ref 6.5–8.1)

## 2020-09-13 LAB — GENETIC SCREENING ORDER

## 2020-09-13 MED ORDER — KETOCONAZOLE 2 % EX CREA: 1.0000 "application " | TOPICAL_CREAM | Freq: Every day | CUTANEOUS | 0 refills | Status: DC

## 2020-09-13 NOTE — Progress Notes (Signed)
Radiation Oncology         (336) [phone] ________________________________  Initial Outpatient Consultation  Name: Sandra Galloway MRN: [id number]  Date: 09/13/2020  DOB: 1946/05/27  VX:[id number], Maebelle Munroe, MD  Erroll Luna, MD   REFERRING PHYSICIAN: Erroll Luna, MD  DIAGNOSIS:    ICD-10-CM   1. Malignant neoplasm of upper-outer quadrant of left breast in female, estrogen receptor positive ([place])  C50.412    Z17.0     Cancer Staging Malignant neoplasm of upper-outer quadrant of left breast in female, estrogen receptor positive ([place]) Staging form: Breast, AJCC 8th Edition - Clinical: Stage IA (cT1a, cN0, cM0, G1, ER+, PR-, HER2-) - Signed by Chauncey Cruel, MD on 09/13/2020 Stage prefix: Initial diagnosis Histologic grading system: 3 grade system     CHIEF COMPLAINT: Here to discuss management of left breast cancer  HISTORY OF PRESENT ILLNESS::Sandra Galloway is a 75 y.o. female who presented with left breast abnormality on the following imaging: bilateral screening mammogam on the date of 08/07/2020. No symptoms were reported at that time. Diagnostic left mammogram on 08/14/2020 revealed suspicious 0.5 cm grouped punctate calcifications in the upper outer left breast. Biopsy on the date of 09/04/2020 showed invasive ductal carcinoma with DCIS. ER status: 100% strong; PR status: 0% negative; Her2 status: negative; Grade: 1.  She underwent LEFT breast conserving surgery and adjuvant radiation therapy approximately 27 years ago and recalls receiving adjuvant tamoxifen.  She was cared for by Dr. Benay Spice and Dr. Sondra Come.  PREVIOUS RADIATION THERAPY: Yes as above  PAST MEDICAL HISTORY:  has a past medical history of Allergic rhinitis, Basal cell carcinoma (BCC) of skin of nose, Breast cancer ([place]), Cancer ([place]), Effusion, right knee, Essential (primary) hypertension, Family history of breast cancer (08/21/2020), Family history of colon cancer (08/21/2020), Family history of pancreatic cancer  (08/21/2020), Family history of uterine cancer (08/21/2020), Hyperlipidemia, Hypothyroidism, Monoallelic mutation of BAP1 gene (09/13/2020), Personal history of breast cancer (08/21/2020), and Varicose veins.    PAST SURGICAL HISTORY: Past Surgical History:  Procedure Laterality Date  . BREAST LUMPECTOMY  1996   right breast  . ENDOVENOUS ABLATION SAPHENOUS VEIN W/ LASER Left 07-22-2013   left greater saphenous vein and sclerotherapy (left leg)  Sherren Mocha Early MD   . ENDOVENOUS ABLATION SAPHENOUS VEIN W/ LASER Right 09-02-2013   right greater saphenous vein and sclerotherapy right leg  by Curt Jews MD  . TUBAL LIGATION  1984    FAMILY HISTORY: family history includes Breast cancer in her sister; Breast cancer (age of onset: 97) in her cousin; Breast cancer (age of onset: 28) in her cousin; Cancer in her father; Cancer - Ovarian in her paternal aunt; Cervical cancer (age of onset: 8) in her daughter; Colon cancer (age of onset: 75) in her mother; Dementia in her mother; Endometrial cancer (age of onset: 54) in her maternal aunt; Heart attack in her father; Heart disease in her father; Hyperlipidemia in her father; Other in her mother; Ovarian cancer (age of onset: 56) in her paternal aunt and paternal aunt; Ovarian cysts in her paternal aunt; Pancreatic cancer (age of onset: 39) in her paternal aunt; Skin cancer in her father.  SOCIAL HISTORY:  reports that she has never smoked. She has never used smokeless tobacco. She reports current alcohol use of about 1.0 standard drink of alcohol per week. She reports that she does not use drugs.  ALLERGIES: Amoxicillin  MEDICATIONS:  Current Outpatient Medications  Medication Sig Dispense Refill  . ergocalciferol (VITAMIN D2) 1.25  MG (50000 UT) capsule Take 50,000 Units by mouth once a week.    Marland Kitchen ketoconazole (NIZORAL) 2 % cream Apply 1 application topically daily. 15 g 0  . levothyroxine (SYNTHROID) 75 MCG tablet synthroid  75 mcg tabs    .  losartan-hydrochlorothiazide (HYZAAR) 50-12.5 MG tablet Take 1 tablet by mouth daily.    . pravastatin (PRAVACHOL) 80 MG tablet Take 80 mg by mouth daily.    . Turmeric 500 MG CAPS Take 1 capsule by mouth in the morning and at bedtime.     No current facility-administered medications for this encounter.    REVIEW OF SYSTEMS: As above   PHYSICAL EXAM:  vitals were not taken for this visit.   General: Alert and oriented, in no acute distress  Breast exam deferred  ECOG = 0  0 - Asymptomatic (Fully active, able to carry on all predisease activities without restriction)  1 - Symptomatic but completely ambulatory (Restricted in physically strenuous activity but ambulatory and able to carry out work of a light or sedentary nature. For example, light housework, office work)  2 - Symptomatic, <50% in bed during the day (Ambulatory and capable of all self care but unable to carry out any work activities. Up and about more than 50% of waking hours)  3 - Symptomatic, >50% in bed, but not bedbound (Capable of only limited self-care, confined to bed or chair 50% or more of waking hours)  4 - Bedbound (Completely disabled. Cannot carry on any self-care. Totally confined to bed or chair)  5 - Death   Eustace Pen MM, Creech RH, Tormey DC, et al. [phone]). "Toxicity and response criteria of the [facility]". [place] Oncol. 5 (6): 649-55   LABORATORY DATA:  Lab Results  Component Value Date   WBC 5.0 09/13/2020   HGB 11.8 (L) 09/13/2020   HCT 36.5 09/13/2020   MCV 91.9 09/13/2020   PLT 324 09/13/2020   CMP     Component Value Date/Time   NA 140 09/13/2020 0833   K 4.1 09/13/2020 0833   CL 105 09/13/2020 0833   CO2 26 09/13/2020 0833   GLUCOSE 89 09/13/2020 0833   BUN 16 09/13/2020 0833   CREATININE 0.79 09/13/2020 0833   CALCIUM 8.7 (L) 09/13/2020 0833   PROT 7.1 09/13/2020 0833   ALBUMIN 3.8 09/13/2020 0833   AST 17 09/13/2020 0833   ALT 11 09/13/2020 0833    ALKPHOS 79 09/13/2020 0833   BILITOT 0.3 09/13/2020 0833   GFRNONAA >60 09/13/2020 1224         RADIOGRAPHY: As above, I personally reviewed her images at tumor board   IMPRESSION/PLAN: Left breast cancer  She has been discussed at our multidisciplinary tumor board.  The consensus is that she would be a good candidate for breast conservation.  While mastectomy is an option, her expected overall survival would be equivalent between mastectomy and breast conservation, as long as she takes antiestrogen therapy. She is enthusiastic about breast conservation.  I do not recommend adjuvant radiation therapy given her previous radiation treatment to the left breast.  Her likelihood of local control is excellent with lumpectomy and antiestrogen therapy, given the 100% ER positivity and the early stage of her cancer.  She is pleased with this plan.  I wished her the very best.  I will see her back as needed.   On date of service, in total, I spent 30 minutes on this encounter. Patient was seen in person.  __________________________________________   Eppie Gibson, MD  This document serves as a record of services personally performed by Eppie Gibson, MD. It was created on his behalf by Clerance Lav, a trained medical scribe. The creation of this record is based on the scribe's personal observations and the provider's statements to them. This document has been checked and approved by the attending provider.

## 2020-09-13 NOTE — H&P (Signed)
Eulis Manly Appointment: 09/13/2020 9:00 AM Location: [facility] Patient #: [id number] DOB: Sep 30, 1945 Married / Language: English / Race: White Female  History of Present Illness Marcello Moores A. Allister Lessley MD; 09/13/2020 11:24 AM) Patient words: Pt presents to the [facility] for stage 1 left breast cancer Pt has a hx of breast conserving surgery in 1996 by Dr Mendel Ryder with ALND and radiation with chemotherapy.   No hx of breast mass pain or discharge  mammogram 5 mm cluster of microcalcifications noted core bx ER POS PR NEG HER 2 NEU NEG KI 2 %    PATH NOT AVAILABLE FROM 1996.  The patient is a 75 year old female.   Past Surgical History Conni Slipper, RN; 09/13/2020 8:19 AM) Breast Biopsy Left. multiple Breast Mass; Local Excision Left. Colon Polyp Removal - Colonoscopy  Diagnostic Studies History Conni Slipper, RN; 09/13/2020 8:19 AM) Colonoscopy 5-10 years ago Mammogram within last year Pap Smear 1-5 years ago  Medication History Conni Slipper, RN; 09/13/2020 8:19 AM) Medications Reconciled  Social History Conni Slipper, RN; 09/13/2020 8:19 AM) Alcohol use Occasional alcohol use. Caffeine use Coffee, Tea. No drug use Tobacco use Never smoker.  Family History Conni Slipper, RN; 09/13/2020 8:19 AM) Arthritis Mother. Breast Cancer Sister. Cervical Cancer Daughter. Colon Cancer Mother. Colon Polyps Brother, Sister. Depression Mother. Heart Disease Father. Hypertension Mother. Malignant Neoplasm Of Pancreas Family Members In General, Father. Ovarian Cancer Family Members In General.  Pregnancy / Birth History Conni Slipper, RN; 09/13/2020 8:19 AM) Age at menarche 57 years. Age of menopause 35-50 Contraceptive History Intrauterine device, Oral contraceptives. Gravida 3 Maternal age 64-20 Para 3 Regular periods  Other Problems Conni Slipper, RN; 09/13/2020 8:19 AM) Arthritis Breast Cancer Emphysema Of Lung Heart murmur High blood  pressure Hypercholesterolemia Melanoma     Review of Systems Conni Slipper RN; 09/13/2020 8:19 AM) General Not Present- Appetite Loss, Chills, Fatigue, Fever, Night Sweats, Weight Gain and Weight Loss. Skin Not Present- Change in Wart/Mole, Dryness, Hives, Jaundice, New Lesions, Non-Healing Wounds, Rash and Ulcer. HEENT Present- Wears glasses/contact lenses. Not Present- Earache, Hearing Loss, Hoarseness, Nose Bleed, Oral Ulcers, Ringing in the Ears, Seasonal Allergies, Sinus Pain, Sore Throat, Visual Disturbances and Yellow Eyes. Respiratory Not Present- Bloody sputum, Chronic Cough, Difficulty Breathing, Snoring and Wheezing. Breast Not Present- Breast Mass, Breast Pain, Nipple Discharge and Skin Changes. Cardiovascular Not Present- Chest Pain, Difficulty Breathing Lying Down, Leg Cramps, Palpitations, Rapid Heart Rate, Shortness of Breath and Swelling of Extremities. Gastrointestinal Not Present- Abdominal Pain, Bloating, Bloody Stool, Change in Bowel Habits, Chronic diarrhea, Constipation, Difficulty Swallowing, Excessive gas, Gets full quickly at meals, Hemorrhoids, Indigestion, Nausea, Rectal Pain and Vomiting. Female Genitourinary Not Present- Frequency, Nocturia, Painful Urination, Pelvic Pain and Urgency. Musculoskeletal Present- Joint Pain. Not Present- Back Pain, Joint Stiffness, Muscle Pain, Muscle Weakness and Swelling of Extremities. Neurological Not Present- Decreased Memory, Fainting, Headaches, Numbness, Seizures, Tingling, Tremor, Trouble walking and Weakness. Psychiatric Not Present- Anxiety, Bipolar, Change in Sleep Pattern, Depression, Fearful and Frequent crying. Endocrine Not Present- Cold Intolerance, Excessive Hunger, Hair Changes, Heat Intolerance, Hot flashes and New Diabetes. Hematology Not Present- Blood Thinners, Easy Bruising, Excessive bleeding, Gland problems, HIV and Persistent Infections.   Physical Exam (Kylyn Sookram A. Marlei Glomski MD; 09/13/2020 11:23  AM)  General Mental Status-Alert. General Appearance-Consistent with stated age. Hydration-Well hydrated. Voice-Normal.  Head and Neck Head-normocephalic, atraumatic with no lesions or palpable masses. Trachea-midline. Thyroid Gland Characteristics - normal size and consistency.  Breast Note: LEFT BREAST SCAR NOTED no right breast  mass  mild brusing left breast scar left axilla  Neurologic Neurologic evaluation reveals -alert and oriented x 3 with no impairment of recent or remote memory. Mental Status-Normal.  Lymphatic Head & Neck  General Head & Neck Lymphatics: Bilateral - Description - Normal. Axillary  General Axillary Region: Bilateral - Description - Normal. Tenderness - Non Tender.    Assessment & Plan (Bobbiejo Ishikawa A. Lajuane Leatham MD; 09/13/2020 11:27 AM)  BREAST CANCER, LEFT (C50.912) Impression: ALND in 1996 discussed lumpectomy vs mastectomy given recent data lumpectomy effective and safe in this pt population and an option over completion mastectomy discussed pro and cons, recurrence rates and complications of each and addition treatments of each   she has opted for left breast seed lumpectomy Risk of lumpectomy include bleeding, infection, seroma, more surgery, use of seed/wire, wound care, cosmetic deformity and the need for other treatments, death , blood clots, death. Pt agrees to proceed.  Current Plans You are being scheduled for surgery- Our schedulers will call you.  You should hear from our office's scheduling department within 5 working days about the location, date, and time of surgery. We try to make accommodations for patient's preferences in scheduling surgery, but sometimes the OR schedule or the surgeon's schedule prevents [place] from making those accommodations.  If you have not heard from our office [phone]) in 5 working days, call the office and ask for your surgeon's nurse.  If you have other questions about your  diagnosis, plan, or surgery, call the office and ask for your surgeon's nurse.  Pt Education - flb breast cancer surgery: discussed with patient and provided information. We discussed the staging and pathophysiology of breast cancer. We discussed all of the different options for treatment for breast cancer including surgery, chemotherapy, radiation therapy, Herceptin, and antiestrogen therapy. We discussed a sentinel lymph node biopsy as she does not appear to having lymph node involvement right now. We discussed the performance of that with injection of radioactive tracer and blue dye. We discussed that she would have an incision underneath her axillary hairline. We discussed that there is a bout a 10-20% chance of having a positive node with a sentinel lymph node biopsy and we will await the permanent pathology to make any other first further decisions in terms of her treatment. One of these options might be to return to the operating room to perform an axillary lymph node dissection. We discussed about a 1-2% risk lifetime of chronic shoulder pain as well as lymphedema associated with a sentinel lymph node biopsy. We discussed the options for treatment of the breast cancer which included lumpectomy versus a mastectomy. We discussed the performance of the lumpectomy with a wire placement. We discussed a 10-20% chance of a positive margin requiring reexcision in the operating room. We also discussed that she may need radiation therapy or antiestrogen therapy or both if she undergoes lumpectomy. We discussed the mastectomy and the postoperative care for that as well. We discussed that there is no difference in her survival whether she undergoes lumpectomy with radiation therapy or antiestrogen therapy versus a mastectomy. There is a slight difference in the local recurrence rate being 3-5% with lumpectomy and about 1% with a mastectomy. We discussed the risks of operation including bleeding, infection,  possible reoperation. She understands her further therapy will be based on what her stages at the time of her operation.  Pt Education - Pamphlet Given - Breast Biopsy: discussed with patient and provided information.

## 2020-09-13 NOTE — Addendum Note (Signed)
Addended by: Chauncey Cruel on: 09/13/2020 01:46 PM   Modules accepted: Orders

## 2020-09-13 NOTE — Progress Notes (Signed)
HPI:  [name] was previously seen in the [facility] due to a personal and family history of cancer and concerns regarding a hereditary predisposition to cancer. Please refer to our prior cancer genetics clinic note for more information regarding our discussion, assessment and recommendations, at the time. [name] recent genetic test results were disclosed to her, as were recommendations warranted by these results. These results and recommendations are discussed in more detail below.  CANCER HISTORY:  At the age of 75, [name] was diagnosed with cancer right breast (ER+). The treatment plan included lumpectomy, adjuvant chemotherapy, adjuvant radiation, and anti-estrogens.  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  More recently, in March 2022, at the age of 75, 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  The preliminary treatment plan includes lumpectomy.   FAMILY HISTORY:  We obtained a detailed, 4-generation family history.  Significant diagnoses are listed below: Family History  Problem Relation Age of Onset  . Colon cancer Mother 30  . Cancer Father        unknown primary; ? pancreatic  . Skin cancer Father   . Breast cancer Sister        Ages 46, 71 (two primaries)  . Endometrial cancer Maternal Aunt 35  . Ovarian cancer Paternal Aunt 15  . Breast cancer Cousin 46  . Cervical cancer Daughter 62  . Ovarian cancer Paternal Aunt 36  . Pancreatic cancer Paternal Aunt 50  . Breast cancer Cousin 66     [name] has three daughters.  One daughter, age 32, was diagnosed with cervical cancer at age 62.  [name] has one brother and one sister.  Her sister had bilateral breast cancer and recently had negative panel genetic testing.  [name] brother has a history of colon polyps but no cancer.   [name] mother  was diagnosed with colon cancer at age 33 and died at age 20.  [name] also has a maternal uncle with colon cancer diagnosed at age 78 and a maternal aunt with uterine cancer diagnosed at age 2.  [name] has a maternal cousin diagnosed with breast cancer at age 60.   [name] father died from an unknown cancer, possibly pancreas, at age 64.  [name] had two paternal aunts with ovarian cancer, a paternal aunt with pancreatic cancer, and a paternal cousin with breast cancer in her 20s.    [name] is unaware of previous family history of genetic testing for hereditary cancer risks besides that mentioned above. Patient's maternal ancestors are of [place] descent, and paternal ancestors are of [place] descent. There is no reported Ashkenazi Jewish ancestry. There is no known consanguinity.  GENETIC TEST RESULTS: Genetic testing reported out on September 11, 2020.  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  No other pathogenic or uncertain variants were detected. The CancerNext-Expanded gene panel offered by [facility] and includes sequencing, rearrangement, and RNA analysis for the following 77 genes: AIP, ALK, APC, ATM, AXIN2, BAP1, BARD1, BLM, BMPR1A, BRCA1, BRCA2, BRIP1, CDC73, CDH1, CDK4, CDKN1B, CDKN2A, CHEK2, CTNNA1, DICER1, FANCC, FH, FLCN, GALNT12, KIF1B, LZTR1, MAX, MEN1, MET, MLH1, MSH2, MSH3, MSH6, MUTYH, NBN, NF1, NF2, NTHL1, PALB2, PHOX2B, PMS2, POT1, PRKAR1A, PTCH1, PTEN, RAD51C, RAD51D, RB1, RECQL, RET, SDHA, SDHAF2, SDHB, SDHC, SDHD, SMAD4, SMARCA4, SMARCB1, SMARCE1,  STK11, SUFU, TMEM127, TP53, TSC1, TSC2, VHL and XRCC2 (sequencing and deletion/duplication); EGFR, EGLN1, HOXB13, KIT, MITF, PDGFRA, POLD1, and POLE (sequencing only); EPCAM and GREM1 (deletion/duplication only).   The test report has been scanned into EPIC and is located under the Molecular Pathology section of the Results Review tab.  A  portion of the result report is included below for reference.     We discussed that the mutation in BAP1 does not seem to explain her personal and family history of cancer. We discussed with [name] that because current genetic testing is not perfect, it is possible there may be a gene mutation in one of these genes that current testing cannot detect, but that chance is small.  We also discussed, that there could be another gene that has not yet been discovered, or that we have not yet tested, that is responsible for the cancer diagnoses in the family. It is also possible there is a hereditary cause for the cancer in the family that [name] did not inherit and therefore was not identified in her testing.  Therefore, it is important to remain in touch with cancer genetics in the future so that we can continue to offer [name] the most up to date genetic testing.   BAP1:  Clinical condition BAP1 hereditary cancer predisposition syndrome is a recently described condition associated with an increased risk for multiple cancers, including uveal melanoma, malignant mesothelioma, cutaneous melanoma, and renal cell carcinoma.  Not everyone with a BAP1 pathogenic variant will manifest symptoms. It has been reported that up to 85% will develop cancer by age 67 (PMID: 28206015). A review article inclusive of 1 families with 174 individuals reported to carry a pathogenic BAP1 variant found 31% of affected individuals to have uveal melanoma, 13% to have cutaneous melanoma, 10% to have renal cell carcinoma, and 22% to have malignant mesothelioma (PMID: 61537943, 27614709).  When malignancies occur in a setting of hereditary BAP1 pathogenic variants, they tend to have an earlier age of onset, be more aggressive, and metastasize more often compared to individuals with sporadic versions of the same types of cancer (PMID: 29574734, 03709643, 83818403). The exception to this is malignant mesothelioma, which is  typically less aggressive and has a more favorable outcome in individuals with BAP1 hereditary cancer predisposition syndrome (PMID: 75436067, 70340352, 48185909).   Most BAP1-affected individuals also develop multiple benign cutaneous melanocytic neoplasms resembling atypical Spitz tumors and dermal nevi, but these neoplasms are clinically, histologically, and genetically distinct. A Spitz tumor is an uncommon benign melanocytic lesion composed of large epithelioid and/or spindled cells. It typically presents in childhood or adolescence as a dome-shaped, pink-red papule, histologically resembling a melanoma but without the typical aggressive clinical behavior associated with adult melanoma (PMID: 31121624).  BAP1-related dermal lesions typically present in childhood or adolescence, often with more developing over a lifetime. The lesions are considered clinically stable, with a low risk of malignancy; however, there have been reports of transformation to malignant melanoma and basal cell carcinoma (PMID: 46950722). There is currently no agreement among pathologists on the classification or nomenclature of these distinctive BAP1 dermal tumors. They may be referred to as combined nevi, melanocytic BAP1-mutated atypical intradermal tumors (MBAITs), atypical Spitz tumors (AST), combined Spitz tumors, or halo Spitz tumors (Barnhill RL. Gennery A. Spitzoid melanocytic neoplasms (Spitz nevus and atypical Spitz tumors). In: UpToDate, Prince Blas (Ed), UpToDate, [place], [place]. Accessed July 2015).  BAP1-related lesions may be used to screen individuals for BAP1 hereditary cancer predisposition syndrome, as  up to 72% of individuals with this condition have one or more atypical Spitz-like tumors. The tumors are characterized by biallelic inactivation of BAP1 and frequent BRAFV600E mutation, both of which can be reliable markers to aid diagnosis. They typically occur earlier than the other associated malignancies, enabling  identification of at-risk individuals, genetic testing, and implementation of surveillance protocols (PMID: 20254270, 62376283).  BAP1 also has limited evidence of an association with meningioma, and is therefore available as a "limited-evidence" gene on the [employer] Nervous System/Brain Cancer Panel (PMID: 15176160). Limited-evidence genes are selected from an extensive review of the literature and expert recommendations, but the association between the gene and the specific condition has not been completely established. This uncertainty may be resolved as new information becomes available, and therefore clinicians may continue to order these limited-evidence genes.  Cancer risks Principal Features Estimated lifetime risk  Uveal melanoma ~31%  Malignant mesothelioma ~22%  Melanoma ~13%  Renal cell carcinoma ~10%  Basal cell carcinoma Unknown    Inheritance BAP1 hereditary cancer predisposition syndrome has autosomal dominant inheritance. This means that an individual with a pathogenic variant in BAP1 has a 50% chance of passing the condition on to their offspring. Once a pathogenic mutation is detected in an individual, it is possible to identify at-risk relatives who can pursue testing for this specific familial variant. Although many cases are inherited from a parent, some occur spontaneously (i.e., an individual with a pathogenic variant has parents who do not have it).  Management There are no established screening or surveillance guidelines for individuals with a pathogenic BAP1 variant, but management recommendations have recently been proposed (PMID: 73710626):   . annual physical examinations . the von Hippel-Lindau syndrome protocol for assessing renal cancer (yearly abdominal ultrasound, MRI every 2 years) . for the risk of uveal melanoma: o annual dilated eye examinations with ophthalmic imaging by an ocular oncologist beginning at age 65 o high-risk monitoring for metastasis if a  uveal melanoma is diagnosed (liver-directed imaging every 3 to 6 months and pulmonary imaging 1 to 2 times per year)  . annual dermatological full-body skin examinations beginning at age 29 o skin self-exam following ABCDE melanoma characteristics o regular use of sun protection o BRAF-mutation testing with immunostaining for BAP1 on biopsied specimens if atypical Spitz-like tumors are detected  Despite the lack of formal medical management guidelines associated with this disorder, an individual's cancer risk and medical management are not determined by genetic test results alone. Overall cancer risk assessment incorporates additional factors, including personal medical history, family history, and any available genetic information that may result in a personalized plan for cancer prevention and surveillance.   Even though the data regarding pathogenic BAP1 are limited, knowing if a pathogenic variant is present is advantageous. At-risk relatives can be identified, enabling pursuit of a diagnostic evaluation. Further, the available information regarding hereditary cancer susceptibility genes is constantly evolving and more clinically relevant data regarding this gene are likely to become available in the near future. Awareness of this cancer predisposition encourages patients and their providers to inform at-risk family members, to consider adopting proposed screening protocols, and to be vigilant in maintaining close and regular contact with their local genetics clinic in anticipation of new information.   RECOMMENDATIONS FOR FAMILY MEMBERS:  [name] children and siblings have a 50% chance to have inherited this mutation. We recommend they have genetic testing for this same mutation, as identifying the presence of this mutation would allow them to also take advantage of  risk-reducing measures.  More distant relatives also have an increased chance of having the BAP1 familial mutation.   Individuals  in this family might be at some increased risk of developing cancer, over the general population risk, simply due to the family history of cancer.  We recommended women in this family have a yearly mammogram beginning at age 75, or 65 years younger than the earliest onset of cancer, an annual clinical breast exam, and perform monthly breast self-exams. Women in this family should also have a gynecological exam as recommended by their primary provider.  First degree relatives of those with colon cancer should receive colonoscopies beginning at age 47, or 10 years prior to the earliest diagnosis of colon cancer in the family, and receive colonoscopies at least every 5 years, or as recommended by their gastroenterologist.    PLAN: [name] is followed by Dr. Barbie Haggis for her eye care, and is followed by Dr. Jamse Belfast in dermatology.  She stated she has an appointment with dermatology in approximately 3 months.  She was provided the phone number to [facility] [phone]) if she wishes to contact them to set up renal screening at a time that is convenient for her.   FOLLOW-UP:  Lastly, we discussed with [name] that cancer genetics is a rapidly advancing field and it is possible that new genetic tests will be appropriate for her and/or her family members in the future. We encouraged her to remain in contact with cancer genetics on an annual basis so we can update her personal and family histories and let her know of advances in cancer genetics that may benefit this family.   Our contact number was provided. [name] questions were answered to her satisfaction, and she knows she is welcome to call us at anytime with additional questions or concerns.   Hercules Hasler M. Joette Catching, [place], [facility] Genetic Counselor [email or web address] (P) [phone]

## 2020-09-13 NOTE — Progress Notes (Signed)
[place] Work  Initial Assessment   Sandra Galloway is a 75 y.o. year old female accompanied by patient and husband, Sandra Galloway. Clinical Social Work was referred by [facility] for assessment of psychosocial needs.   SDOH (Social Determinants of Health) assessments performed: Yes SDOH Interventions   Flowsheet Row Most Recent Value  SDOH Interventions   Food Insecurity Interventions Intervention Not Indicated  Housing Interventions Intervention Not Indicated  Transportation Interventions Intervention Not Indicated      Distress Screen completed: Yes ONCBCN DISTRESS SCREENING 09/13/2020  Screening Type Initial Screening  Distress experienced in past week (1-10) 0      Family/Social Information:  . Housing Arrangement: patient lives with husband, Sandra Galloway . Family members/support persons in your life? Family (husband, 3 daughters, 8 grandchildren), Friends and [employer] . Transportation concerns: no  . 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 Financial concerns: No o Type of concern: None . Food access concerns: no . Religious or spiritual practice: yes, belongs to a church community . Services Currently in place:  n/a  Coping/ Adjustment to diagnosis: . Patient understands treatment plan and what happens next? Yes. Has been through breast cancer before (in 1996). Grateful she does not need radiation or chemotherapy this time . Concerns about diagnosis and/or treatment: I'm not especially worried about anything . Patient reported stressors: none . Patient enjoys reading, time with family/ friends and walking . Current coping skills/ strengths: Active sense of humor, Capable of independent living, Motivation for treatment/growth, Religious Affiliation, Special hobby/interest and Supportive family/friends    SUMMARY: Current SDOH Barriers:  . No significant SDOH barriers noted today  Clinical Social Work Clinical Goal(s):  Marland Kitchen No  clinical SW goals at this time  Interventions: . Discussed common feeling and emotions when being diagnosed with cancer, and the importance of support during treatment . Informed patient of the support team roles and support services at [facility] . Provided CSW contact information and encouraged patient to call with any questions or concerns  Follow Up Plan: Patient will contact CSW with any support or resource needs Patient verbalizes understanding of plan: Yes    Christeen Douglas , LCSW

## 2020-09-15 ENCOUNTER — Telehealth: Payer: Self-pay

## 2020-09-15 ENCOUNTER — Telehealth: Payer: Self-pay | Admitting: Oncology

## 2020-09-15 NOTE — Telephone Encounter (Signed)
Scheduled appt per 3/31 los. Called pt, no answer. Left msg with appt date and time.

## 2020-09-15 NOTE — Telephone Encounter (Signed)
Pt called stating she had Rx called in for Ketoconazole and she is unsure why. Pt denies redness/itchiness. Note deferred to Dr Jana Hakim for review. Pt is aware and verbalizes thanks and understanding.

## 2020-09-18 ENCOUNTER — Telehealth: Payer: Self-pay | Admitting: *Deleted

## 2020-09-18 ENCOUNTER — Telehealth: Payer: Self-pay

## 2020-09-18 ENCOUNTER — Encounter: Payer: Self-pay | Admitting: *Deleted

## 2020-09-18 NOTE — Telephone Encounter (Signed)
Spoke with patient to follow up from [facility] 3/30 and assess navigation needs. Patient denies any questions or concerns at this time. Encouraged her to call should anything arise. Patient verbalized understanding.  Patient is scheduled for sx at [facility]. She is aware of her follow up appointment with Dr. Jana Hakim for 5/12 at 10am.

## 2020-09-18 NOTE — Telephone Encounter (Signed)
Contacted pt to let her know she did not need to use the Nizorale cream. Pt verbalized understanding.

## 2020-09-19 HISTORY — PX: BREAST LUMPECTOMY: SHX2

## 2020-09-21 ENCOUNTER — Telehealth: Payer: Self-pay | Admitting: *Deleted

## 2020-09-21 NOTE — Telephone Encounter (Signed)
Dr Silverio Decamp,  This pt is scheduled to have a colon with you for a hx of Polyps- [facility] in mother age 74 with dx on 5-2 Monday    She had the noted surgery 09-19-2020 at [facility] Routine 09/19/2020 2:07 PM EDT Recurrent cancer of left breast (*)  Results for this procedure are in the results section.    Does she need to wait 8-12 weeks post her Mastectomy for her Colon?  Im  not sure of the future plan for Chemo/ Radiation - I attempted the pt  and was not able to get her  Please advise- Thanks Lelan Pons

## 2020-09-21 NOTE — Telephone Encounter (Signed)
Its fine to proceed with colonoscopy. Thanks

## 2020-09-26 ENCOUNTER — Encounter: Payer: Self-pay | Admitting: *Deleted

## 2020-09-28 ENCOUNTER — Telehealth: Payer: Self-pay | Admitting: Radiation Oncology

## 2020-09-28 NOTE — Telephone Encounter (Signed)
Called and faxed records and imaging request to [facility].

## 2020-10-02 ENCOUNTER — Telehealth: Payer: Self-pay | Admitting: Radiation Oncology

## 2020-10-02 ENCOUNTER — Other Ambulatory Visit: Payer: Self-pay | Admitting: Oncology

## 2020-10-02 ENCOUNTER — Encounter: Payer: Self-pay | Admitting: *Deleted

## 2020-10-02 NOTE — Telephone Encounter (Signed)
Rec'd a message from Dr. Isidore Moos in Radiation stating that she rec'd Dr. Marin Comment note and that the plan would not call for more radiation, but to f/u with Medical Oncology. Dr. Jana Hakim and Reece Packer in Medical Oncology were copied on this note and I've left a voicemail for the patient to call me.

## 2020-10-02 NOTE — Progress Notes (Unsigned)
09/19/2020: Left breast lymphoscintigraphy, left wire-localized partial mastectomy. Preoperative lymphoscintigraphy revealed no migration of nuclear medicine uptake to the previously dissected left axillary lymph node basin; therefore, left axillary sentinel lymph node surgery was not performed. 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 A reexcised anterior margin revealed no further malignancy. There was no residual invasive disease.  (From Dr Marin Comment note)

## 2020-10-03 ENCOUNTER — Ambulatory Visit (AMBULATORY_SURGERY_CENTER): Payer: Self-pay

## 2020-10-03 ENCOUNTER — Other Ambulatory Visit: Payer: Self-pay

## 2020-10-03 VITALS — Ht 64.0 in | Wt 147.0 lb

## 2020-10-03 DIAGNOSIS — Z86010 Personal history of colonic polyps: Secondary | ICD-10-CM

## 2020-10-03 MED ORDER — NA SULFATE-K SULFATE-MG SULF 17.5-3.13-1.6 GM/177ML PO SOLN: 1.0000 | Freq: Once | ORAL | 0 refills | Status: AC

## 2020-10-03 NOTE — Progress Notes (Signed)
No egg or soy allergy known to patient  No issues with past sedation with any surgeries or procedures Patient denies ever being told they had issues or difficulty with intubation  No FH of Malignant Hyperthermia No diet pills per patient No home 02 use per patient  No blood thinners per patient  Pt denies issues with constipation  No A fib or A flutter  EMMI video via MyChart  COVID 19 guidelines implemented in PV today with Pt and RN  Pt is fully vaccinated for Covid  Coupon given to pt in PV today , Code to Pharmacy and NO PA's for preps discussed with pt in PV today  Discussed with pt there will be an out-of-pocket cost for prep and that varies from $0 to 70 dollars  Due to the COVID-19 pandemic we are asking patients to follow certain guidelines.  Pt aware of COVID protocols and [facility] guidelines

## 2020-10-09 ENCOUNTER — Other Ambulatory Visit: Payer: Self-pay | Admitting: *Deleted

## 2020-10-16 ENCOUNTER — Encounter: Payer: Medicare Other | Admitting: Gastroenterology

## 2020-10-16 ENCOUNTER — Encounter: Payer: Self-pay | Admitting: Gastroenterology

## 2020-10-17 ENCOUNTER — Ambulatory Visit (AMBULATORY_SURGERY_CENTER): Payer: Medicare Other | Admitting: Gastroenterology

## 2020-10-17 ENCOUNTER — Encounter: Payer: Self-pay | Admitting: Gastroenterology

## 2020-10-17 ENCOUNTER — Other Ambulatory Visit: Payer: Self-pay

## 2020-10-17 VITALS — BP 112/50 | HR 70 | Temp 97.5°F | Resp 15 | Ht 64.0 in | Wt 147.0 lb

## 2020-10-17 DIAGNOSIS — D122 Benign neoplasm of ascending colon: Secondary | ICD-10-CM

## 2020-10-17 DIAGNOSIS — Z86010 Personal history of colonic polyps: Secondary | ICD-10-CM

## 2020-10-17 DIAGNOSIS — D120 Benign neoplasm of cecum: Secondary | ICD-10-CM

## 2020-10-17 DIAGNOSIS — D128 Benign neoplasm of rectum: Secondary | ICD-10-CM

## 2020-10-17 HISTORY — PX: COLONOSCOPY: SHX174

## 2020-10-17 MED ORDER — SODIUM CHLORIDE 0.9 % IV SOLN: 500.0000 mL | Freq: Once | INTRAVENOUS | Status: DC

## 2020-10-17 NOTE — Progress Notes (Signed)
Called to room to assist during endoscopic procedure.  Patient ID and intended procedure confirmed with present staff. Received instructions for my participation in the procedure from the performing physician.  

## 2020-10-17 NOTE — Patient Instructions (Signed)
Await pathology- next colonoscopy based on pathology results  Continue your normal medications  Please read over handouts about polyps, hemorrhoids and diverticulosis    YOU HAD AN ENDOSCOPIC PROCEDURE TODAY AT [facility]:   Refer to the procedure report that was given to you for any specific questions about what was found during the examination.  If the procedure report does not answer your questions, please call your gastroenterologist to clarify.  If you requested that your care partner not be given the details of your procedure findings, then the procedure report has been included in a sealed envelope for you to review at your convenience later.  YOU SHOULD EXPECT: Some feelings of bloating in the abdomen. Passage of more gas than usual.  Walking can help get rid of the air that was put into your GI tract during the procedure and reduce the bloating. If you had a lower endoscopy (such as a colonoscopy or flexible sigmoidoscopy) you may notice spotting of blood in your stool or on the toilet paper. If you underwent a bowel prep for your procedure, you may not have a normal bowel movement for a few days.  Please Note:  You might notice some irritation and congestion in your nose or some drainage.  This is from the oxygen used during your procedure.  There is no need for concern and it should clear up in a day or so.  SYMPTOMS TO REPORT IMMEDIATELY:   Following lower endoscopy (colonoscopy or flexible sigmoidoscopy):  Excessive amounts of blood in the stool  Significant tenderness or worsening of abdominal pains  Swelling of the abdomen that is new, acute  Fever of 100F or higher  For urgent or emergent issues, a gastroenterologist can be reached at any hour by calling [phone]. Do not use MyChart messaging for urgent concerns.    DIET:  We do recommend a small meal at first, but then you may proceed to your regular diet.  Drink plenty of fluids but you should  avoid alcoholic beverages for 24 hours.  ACTIVITY:  You should plan to take it easy for the rest of today and you should NOT DRIVE or use heavy machinery until tomorrow (because of the sedation medicines used during the test).    FOLLOW UP: Our staff will call the number listed on your records 48-72 hours following your procedure to check on you and address any questions or concerns that you may have regarding the information given to you following your procedure. If we do not reach you, we will leave a message.  We will attempt to reach you two times.  During this call, we will ask if you have developed any symptoms of COVID 19. If you develop any symptoms (ie: fever, flu-like symptoms, shortness of breath, cough etc.) before then, please call [phone].  If you test positive for Covid 19 in the 2 weeks post procedure, please call and report this information to [place].    If any biopsies were taken you will be contacted by phone or by letter within the next 1-3 weeks.  Please call us at [phone] if you have not heard about the biopsies in 3 weeks.    SIGNATURES/CONFIDENTIALITY: You and/or your care partner have signed paperwork which will be entered into your electronic medical record.  These signatures attest to the fact that that the information above on your After Visit Summary has been reviewed and is understood.  Full responsibility of the confidentiality of this discharge  information lies with you and/or your care-partner.

## 2020-10-17 NOTE — Progress Notes (Signed)
Pt's states no medical or surgical changes since previsit or office visit.  Vitals MO 

## 2020-10-17 NOTE — Op Note (Signed)
[facility] Patient Name: Sandra Galloway Procedure Date: 10/17/2020 9:23 AM MRN: [id number] Endoscopist: Mauri Pole , MD Age: 75 Referring MD:  Date of Birth: 01-26-1946 Gender: Female Account #: [id number] Procedure:                Colonoscopy Indications:              High risk colon cancer surveillance: Personal                            history of colonic polyps Medicines:                Monitored Anesthesia Care Procedure:                Pre-Anesthesia Assessment:                           - Prior to the procedure, a History and Physical                            was performed, and patient medications and                            allergies were reviewed. The patient's tolerance of                            previous anesthesia was also reviewed. The risks                            and benefits of the procedure and the sedation                            options and risks were discussed with the patient.                            All questions were answered, and informed consent                            was obtained. Prior Anticoagulants: The patient has                            taken no previous anticoagulant or antiplatelet                            agents. ASA Grade Assessment: II - A patient with                            mild systemic disease. After reviewing the risks                            and benefits, the patient was deemed in                            satisfactory condition to undergo the procedure.  After obtaining informed consent, the colonoscope                            was passed under direct vision. Throughout the                            procedure, the patient's blood pressure, pulse, and                            oxygen saturations were monitored continuously. The                            Olympus PFC-[id number]) Colonoscope was                            introduced through the anus and  advanced to the the                            cecum, identified by appendiceal orifice and                            ileocecal valve. The colonoscopy was performed                            without difficulty. The patient tolerated the                            procedure well. The quality of the bowel                            preparation was excellent. The ileocecal valve,                            appendiceal orifice, and rectum were photographed. Scope In: 9:40:27 AM Scope Out: 10:02:44 AM Scope Withdrawal Time: 0 hours 14 minutes 37 seconds  Total Procedure Duration: 0 hours 22 minutes 17 seconds  Findings:                 The perianal and digital rectal examinations were                            normal.                           A less than 1 mm polyp was found in the cecum. The                            polyp was sessile. The polyp was removed with a                            cold biopsy forceps. Resection and retrieval were                            complete.  Six sessile polyps were found in the rectum X1,                            transverse colon X1 and ascending colon X4. The                            polyps were 4 to 7 mm in size. These polyps were                            removed with a cold snare. Resection and retrieval                            were complete.                           Scattered small and large-mouthed diverticula were                            found in the sigmoid colon, descending colon,                            transverse colon and ascending colon. There was no                            evidence of diverticular bleeding.                           Non-bleeding internal hemorrhoids were found during                            retroflexion. The hemorrhoids were medium-sized. Complications:            No immediate complications. Estimated Blood Loss:     Estimated blood loss was minimal. Impression:                - One less than 1 mm polyp in the cecum, removed                            with a cold biopsy forceps. Resected and retrieved.                           - Six 4 to 7 mm polyps in the rectum, in the                            transverse colon and in the ascending colon,                            removed with a cold snare. Resected and retrieved.                           - Moderate diverticulosis in the sigmoid colon, in                            the  descending colon, in the transverse colon and                            in the ascending colon. There was no evidence of                            diverticular bleeding.                           - Non-bleeding internal hemorrhoids. Recommendation:           - Patient has a contact number available for                            emergencies. The signs and symptoms of potential                            delayed complications were discussed with the                            patient. Return to normal activities tomorrow.                            Written discharge instructions were provided to the                            patient.                           - Resume previous diet.                           - Continue present medications.                           - Await pathology results.                           - Repeat colonoscopy in 3 - 5 years for                            surveillance based on pathology results. Mauri Pole, MD 10/17/2020 10:11:08 AM This report has been signed electronically.

## 2020-10-17 NOTE — Progress Notes (Signed)
To pacu, VSs. Report to [email or web address] 

## 2020-10-19 ENCOUNTER — Telehealth: Payer: Self-pay | Admitting: *Deleted

## 2020-10-19 NOTE — Telephone Encounter (Signed)
  Follow up Call-  Call back number 10/17/2020  Post procedure Call Back phone  # [phone]  Permission to leave phone message Yes  Some recent data might be hidden     Patient questions:  Do you have a fever, pain , or abdominal swelling? No. Pain Score  0 *  Have you tolerated food without any problems? Yes.    Have you been able to return to your normal activities? Yes.    Do you have any questions about your discharge instructions: Diet   No. Medications  No. Follow up visit  No.  Do you have questions or concerns about your Care? No.  Actions: * If pain score is 4 or above: 1. No action needed, pain <4.Have you developed a fever since your procedure? no  2.   Have you had an respiratory symptoms (SOB or cough) since your procedure? no  3.   Have you tested positive for COVID 19 since your procedure no  4.   Have you had any family members/close contacts diagnosed with the COVID 19 since your procedure?  no   If yes to any of these questions please route to Joylene John, RN and Joella Prince, RN

## 2020-10-20 ENCOUNTER — Ambulatory Visit: Payer: Medicare Other | Admitting: Radiation Oncology

## 2020-10-20 ENCOUNTER — Ambulatory Visit: Payer: Medicare Other

## 2020-10-26 ENCOUNTER — Other Ambulatory Visit: Payer: Self-pay

## 2020-10-26 ENCOUNTER — Encounter: Payer: Self-pay | Admitting: *Deleted

## 2020-10-26 ENCOUNTER — Inpatient Hospital Stay: Payer: Medicare Other | Attending: Genetic Counselor | Admitting: Oncology

## 2020-10-26 VITALS — BP 101/78 | HR 76 | Temp 97.7°F | Resp 18 | Ht 64.0 in | Wt 146.3 lb

## 2020-10-26 DIAGNOSIS — Z170 Estrogen receptor positive status [ER+]: Secondary | ICD-10-CM | POA: Diagnosis not present

## 2020-10-26 DIAGNOSIS — Z800 Family history of malignant neoplasm of digestive organs: Secondary | ICD-10-CM | POA: Diagnosis not present

## 2020-10-26 DIAGNOSIS — C773 Secondary and unspecified malignant neoplasm of axilla and upper limb lymph nodes: Secondary | ICD-10-CM | POA: Insufficient documentation

## 2020-10-26 DIAGNOSIS — Z803 Family history of malignant neoplasm of breast: Secondary | ICD-10-CM | POA: Insufficient documentation

## 2020-10-26 DIAGNOSIS — Z8049 Family history of malignant neoplasm of other genital organs: Secondary | ICD-10-CM | POA: Insufficient documentation

## 2020-10-26 DIAGNOSIS — E785 Hyperlipidemia, unspecified: Secondary | ICD-10-CM | POA: Diagnosis not present

## 2020-10-26 DIAGNOSIS — Z79899 Other long term (current) drug therapy: Secondary | ICD-10-CM | POA: Diagnosis not present

## 2020-10-26 DIAGNOSIS — Z79811 Long term (current) use of aromatase inhibitors: Secondary | ICD-10-CM | POA: Insufficient documentation

## 2020-10-26 DIAGNOSIS — I10 Essential (primary) hypertension: Secondary | ICD-10-CM | POA: Insufficient documentation

## 2020-10-26 DIAGNOSIS — C50412 Malignant neoplasm of upper-outer quadrant of left female breast: Secondary | ICD-10-CM | POA: Insufficient documentation

## 2020-10-26 DIAGNOSIS — Z1379 Encounter for other screening for genetic and chromosomal anomalies: Secondary | ICD-10-CM

## 2020-10-26 DIAGNOSIS — Z923 Personal history of irradiation: Secondary | ICD-10-CM | POA: Diagnosis not present

## 2020-10-26 MED ORDER — ANASTROZOLE 1 MG PO TABS: 1.0000 mg | ORAL_TABLET | Freq: Every day | ORAL | 4 refills | Status: DC

## 2020-10-26 NOTE — Progress Notes (Signed)
Sandra Galloway  [phone] [phone]     ID: Sandra Galloway DOB: [birth year]  MR#: [id number]  KVQ#:[id number]  Patient Care Team: Sandra Rasmussen, Galloway as PCP - General (Family Medicine) Sandra Kaufmann, RN as Oncology Nurse Navigator Sandra Germany, RN as Oncology Nurse Navigator Sandra Luna, Galloway as Consulting Physician (General Surgery) Sandra Galloway, Sandra Dad, Galloway as Consulting Physician (Oncology) Sandra Gibson, Galloway as Attending Physician (Radiation Oncology) Sandra Beam, Galloway as Consulting Physician (Neurology) Sandra Breath., Galloway (Ophthalmology) Sandra Pi, Galloway as Referring Physician (Endocrinology) Sandra Gallo, Galloway as Consulting Physician (Dermatology) Sandra Cruel, Galloway OTHER Galloway:  CHIEF COMPLAINT: Estrogen receptor positive breast cancer; BAP1 mutation  CURRENT TREATMENT: Anastrozole   INTERVAL HISTORY: Sandra Galloway returns today for follow up of her new left breast cancer. She was evaluated in the multidisciplinary breast cancer clinic on 09/13/2020.  She underwent left lumpectomy on 09/19/2020 under Dr. Jackson Latino at [place]. Pathology from the procedure ([id number]) showed: ductal carcinoma in situ, grade 2; no residual invasive carcinoma; margins negative.  She is here today also to discuss the implications of her deleterious mutations in BAP1   REVIEW OF SYSTEMS: Sandra Galloway did well with her surgery, with no unusual pain, fever, bleeding or other complications.  She had a colonoscopy which showed no evidence of cancer and this is being repeated 3 years from now.  She has good follow-up of her skin and issues as detailed below.  A detailed review of systems today was otherwise stable   COVID 19 VACCINATION STATUS: fully vaccinated [employer]), with booster 04/2020   HISTORY OF CURRENT ILLNESS: From the original intake note:  Sandra Galloway has a history of left breast cancer in 1996. She was treated by Dr. Malachy Galloway with lumpectomy, axillary lymph  node dissection (3 out of 10 removed lymph nodes were positive), chemotherapy (likely cyclophosphamide and doxorubicin every 3 weeks x 6 from her description), followed by radiation and then tamoxifen for 5 years.  She had routine screening mammography on 08/07/2020 showing a possible abnormality in the left breast. She underwent left diagnostic mammography with tomography at [facility] on 08/14/2020 showing: breast density category B; 0.5 cm grouped punctate calcifications in upper-outer left breast.  Accordingly on 09/04/2020 she proceeded to biopsy of the left breast area in question. The pathology from this procedure ([id number]) showed: invasive ductal carcinoma, three foci, grade 1; ductal carcinoma in situ. Prognostic indicators significant for: estrogen receptor, 100% positive with strong staining intensity and progesterone receptor, 0% negative. Proliferation marker Ki67 at 2%. HER2 negative by immunohistochemistry (0).  Cancer Staging Malignant neoplasm of upper-outer quadrant of left breast in female, estrogen receptor positive ([place]) Staging form: Breast, AJCC 8th Edition - Clinical: Stage IA (cT1a, cN0, cM0, G1, ER+, PR-, HER2-) - Signed by Sandra Cruel, Galloway on 09/13/2020 Stage prefix: Initial diagnosis Histologic grading system: 3 grade system  The patient's subsequent history is as detailed below.   PAST MEDICAL HISTORY: Past Medical History:  Diagnosis Date  . Allergic rhinitis   . Arthritis    generalized  . Basal cell carcinoma (BCC) of skin of nose   . Breast cancer ([place])    LEFT  . Cancer ([place])    LEFT breast  . Cataract    RIGHT eye-not a surgical candidate at this time-  . Effusion, right knee   . Essential (primary) hypertension    on meds  . Family history of breast cancer 08/21/2020  . Family history of  colon cancer 08/21/2020  . Family history of pancreatic cancer 08/21/2020  . Family history of uterine cancer 08/21/2020  . Hyperlipidemia    on meds  .  Hypothyroidism    on meds  . 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 Personal history of breast cancer 08/21/2020  . Varicose veins     PAST SURGICAL HISTORY: Past Surgical History:  Procedure Laterality Date  . BREAST LUMPECTOMY  1996   right breast  . BREAST LUMPECTOMY  09/19/2020  . COLONOSCOPY  2016   KN-MAC-pre p good-TICS-2 TA/SSP  . COLONOSCOPY  10/17/2020   KN  . ENDOVENOUS ABLATION SAPHENOUS VEIN W/ LASER Left 07-22-2013   left greater saphenous vein and sclerotherapy (left leg)  Sandra Galloway   . ENDOVENOUS ABLATION SAPHENOUS VEIN W/ LASER Right 09-02-2013   right greater saphenous vein and sclerotherapy right leg  by Sandra Galloway  . POLYPECTOMY  2016   TA/SSP  . TUBAL LIGATION  1984    FAMILY HISTORY: Family History  Problem Relation Age of Onset  . Other Mother        varicose veins  . Colon cancer Mother 74  . Dementia Mother   . Colon polyps Mother 44  . Heart disease Father   . Hyperlipidemia Father   . Heart attack Father   . Cancer Father        unknown primary; ? pancreatic  . Skin cancer Father   . Breast cancer Sister        Ages 40, 2 (two primaries)  . Colon polyps Sister 34  . Endometrial cancer Maternal Aunt 35  . Ovarian cancer Paternal Aunt 78  . Ovarian cysts Paternal Aunt   . Cancer - Ovarian Paternal Aunt   . Breast cancer Cousin 67  . Cervical cancer Daughter 47  . Ovarian cancer Paternal Aunt 78  . Pancreatic cancer Paternal Aunt 89  . Breast cancer Cousin 31  . Colon polyps Brother 63  . Esophageal cancer Neg Hx   . Stomach cancer Neg Hx   . Rectal cancer Neg Hx    Her father died at age 46 from pancreatic cancer. Her mother died at age 6 and had a history of colon cancer at age 71. Sandra Galloway has 2 brothers and 2 sisters. She also reports breast cancer in a sister (Sandra Galloway, who is also my patient) at age 29 with recurrence at age 67, ovarian cancer in two paternal aunts, cervical cancer in her daughter at age 73, and an unknown  cancer in a maternal aunt.   GYNECOLOGIC HISTORY:  No LMP recorded. Patient is postmenopausal. Menarche: 75 years old Age at first live birth: 75 years old [place] P 3 LMP 88 Contraceptive never used HRT never used  Hysterectomy? no BSO? no   SOCIAL HISTORY: (updated 08/2020)  [place] is currently retired from working as a Education officer, museum. She is recently married! Husband Maire Govan is retired from working as Development worker, international aid of the career center at [employer] [place]. Daughter Otelia Limes, age 81, is the owner of a [employer] and lives in [place]. Daughter Jethro Bastos, age 49, is a Ship broker" and lives in [place]. Daughter Chayna Surratt, age 45, is a Education officer, museum in [place]. The patient has 8 grandchildren. She attends [employer] ([employer]).    ADVANCED DIRECTIVES: in place   HEALTH MAINTENANCE: Social History   Tobacco Use  . Smoking status: Never Smoker  . Smokeless  tobacco: Never Used  Vaping Use  . Vaping Use: Never used  Substance Use Topics  . Alcohol use: Yes    Alcohol/week: 1.0 standard drink    Types: 1 Glasses of wine per week    Comment: 1 per month  . Drug use: No     Colonoscopy: 10/17/2020 (Nandigam).  PAP: 2020  Bone density: 08/2020   Allergies  Allergen Reactions  . Amoxicillin     REACTION: rash    Current Outpatient Medications  Medication Sig Dispense Refill  . anastrozole (ARIMIDEX) 1 MG tablet Take 1 tablet (1 mg total) by mouth daily. 90 tablet 4  . acetaminophen (TYLENOL) 500 MG tablet Take by mouth daily as needed.    Marland Kitchen alendronate (FOSAMAX) 70 MG tablet Take 1 tablet by mouth once a week.    Marland Kitchen CALCIUM-VITAMIN D PO Take 1 tablet by mouth 2 (two) times daily.    . ergocalciferol (VITAMIN D2) 1.25 MG (50000 UT) capsule Take 50,000 Units by mouth once a week.    Marland Kitchen FIBER PO Take by mouth daily as needed.    Marland Kitchen levothyroxine (SYNTHROID) 75 MCG tablet synthroid  75 mcg tabs    .  losartan-hydrochlorothiazide (HYZAAR) 50-12.5 MG tablet Take 1 tablet by mouth daily.    . pravastatin (PRAVACHOL) 80 MG tablet Take 80 mg by mouth daily.    . Sod Citrate-Citric Acid (CITRIC ACID-SODIUM CITRATE PO) Take by mouth daily as needed.     No current facility-administered medications for this visit.    OBJECTIVE: White woman who appears younger than stated age  39:   10/26/20 1013  BP: 101/78  Pulse: 76  Resp: 18  Temp: 97.7 F (36.5 C)  SpO2: 97%     Body mass index is 25.11 kg/m.   Wt Readings from Last 3 Encounters:  10/26/20 146 lb 4.8 oz (66.4 kg)  10/17/20 147 lb (66.7 kg)  10/03/20 147 lb (66.7 kg)      ECOG FS:1 - Symptomatic but completely ambulatory  Sclerae unicteric, EOMs intact Oropharynx clear and moist No cervical or supraclavicular adenopathy Lungs no rales or rhonchi Heart regular rate and rhythm Abd soft, nontender, positive bowel sounds MSK no focal spinal tenderness, no upper extremity lymphedema Neuro: nonfocal, well oriented, appropriate affect Breasts: Right breast is benign.  The left breast has undergone lumpectomy, axillary lymph node dissection, and radiation remotely and now status post second lumpectomy.  The cosmetic result is good.  Incision is healing nicely without dehiscence erythema or swelling.  Both axillae are benign.     LAB RESULTS:  CMP     Component Value Date/Time   NA 140 09/13/2020 0833   K 4.1 09/13/2020 0833   CL 105 09/13/2020 0833   CO2 26 09/13/2020 0833   GLUCOSE 89 09/13/2020 0833   BUN 16 09/13/2020 0833   CREATININE 0.79 09/13/2020 0833   CALCIUM 8.7 (L) 09/13/2020 0833   PROT 7.1 09/13/2020 0833   ALBUMIN 3.8 09/13/2020 0833   AST 17 09/13/2020 0833   ALT 11 09/13/2020 0833   ALKPHOS 79 09/13/2020 0833   BILITOT 0.3 09/13/2020 0833   GFRNONAA >60 09/13/2020 0833    No results found for: TOTALPROTELP, ALBUMINELP, A1GS, A2GS, BETS, BETA2SER, GAMS, MSPIKE, SPEI  Lab Results  Component  Value Date   WBC 5.0 09/13/2020   NEUTROABS 2.8 09/13/2020   HGB 11.8 (L) 09/13/2020   HCT 36.5 09/13/2020   MCV 91.9 09/13/2020   PLT 324 09/13/2020  No results found for: LABCA2  No components found for: [id number]  No results for input(s): INR in the last 168 hours.  No results found for: LABCA2  No results found for: QXI503  No results found for: UUE280  No results found for: KLK917  No results found for: CA2729  No components found for: HGQUANT  No results found for: CEA1 / No results found for: CEA1   No results found for: AFPTUMOR  No results found for: CHROMOGRNA  No results found for: KPAFRELGTCHN, LAMBDASER, KAPLAMBRATIO (kappa/lambda light chains)  No results found for: HGBA, HGBA2QUANT, HGBFQUANT, HGBSQUAN (Hemoglobinopathy evaluation)   No results found for: LDH  No results found for: IRON, TIBC, IRONPCTSAT (Iron and TIBC)  No results found for: FERRITIN  Urinalysis No results found for: COLORURINE, APPEARANCEUR, LABSPEC, PHURINE, GLUCOSEU, HGBUR, BILIRUBINUR, KETONESUR, PROTEINUR, UROBILINOGEN, NITRITE, LEUKOCYTESUR   STUDIES: No results found. Specimen:  Tissue - Breast structure (body structure), Tissue specimen (specimen) - Breast structure (body s... Component 1 mo ago  Case Report  Surgical Pathology                Case: [id number]                  Authorizing Provider: Aldean Jewett, Galloway      Collected:      09/19/2020 1320        Ordering Location:   [facility]  Received:      09/19/2020 1755        Pathologist:      Judithann Sauger, Galloway                               Specimens:  1) - Breast, Partial Mastectomy, Left superior periareolar partial mastectomy           2) - Breast, Left, Left breast anterior magin side facing away from [place] represents        final anterior margin                                   Final Diagnosis     1.  Breast, left superior areolar, partial mastectomy - Ductal carcinoma in situ (DCIS), nuclear grade 2 of 3, solid pattern. - Background breast with fat necrosis and biopsy site changes. - Margins negative for carcinoma (DCIS  82m to anterior and 1 mm to superior margin); See part 2 for additional margins. - AJCC pT1aNx; See synoptic report.  Note: The lumpectomy specimen shows scattered foci of ductal carcinoma in situ without evidence of an invasive component.  Immunostains (pancytokeratin, p63, myosin) show intact myoepithelial cells with adequate controls.  DCIS is present 1 mm from anterior margin (slide 1E) and 1 mm to superior margin (slide 1G). See part 2 for additional margin status. The previous biopsy at this location shows 1.5 mm invasive carcinoma, grade 1 of 3 ([id number]), which is used for staging purposes.   2.  Breast, left anterior margin, reexcision: - Benign breast tissue with stromal hyalinosis, fat necrosis, and focal dystrophic calcifications. - Negative for malignancy.   Electronically signed by NJudithann Sauger Galloway on 09/22/2020 at 2:05 PM  Synoptic Report  INVASIVE CARCINOMA OF THE BREAST: Resection  INVASIVE CARCINOMA OF THE BREAST: COMPLETE EXCISION - All Specimens  8th Edition - Protocol posted: 12/15/2019   SPECIMEN   Procedure:  Excision (less than total mastectomy)   Specimen Laterality:  Left   TUMOR   Tumor Site:  Superior   Histologic Type:  Invasive carcinoma of no special type (ductal)   Histologic Grade (Nottingham Histologic Score):      Glandular (Acinar) / Tubular Differentiation:  Score cannot be determined: Biopsy not available for eview.    Nuclear Pleomorphism:  Score cannot be determined: Biopsy not available for eview.    Mitotic Rate:  Score cannot be determined: Biopsy not available for eview.    Overall Grade:  Grade 1 (scores of 3, 4 or 5)    Tumor Size:  Greatest dimension of largest invasive focus (Millimeters): 1.5 mm   Ductal Carcinoma In Situ (DCIS):  Present    :  Negative for extensive intraductal component (EIC)    Architectural Patterns:  Solid    Nuclear Grade:  Grade II (intermediate)    Necrosis:  Not identified   Lobular Carcinoma In Situ (LCIS):  Not identified   Tumor Extent:      Tumor Extent:  Not applicable (skin, nipple, and skeletal muscle are absent)   Lymphovascular Invasion:  Not identified   Dermal Lymphovascular Invasion:  No skin present   Microcalcifications:  Present in non-neoplastic tissue   Treatment Effect in the Breast:  No known presurgical therapy   Treatment Effect in the Lymph Nodes:  Not applicable   MARGINS   Margin Status for Invasive Carcinoma:  All margins negative for invasive carcinoma    Distance from Invasive Carcinoma to Closest Margin:  Cannot be determined: Invasive carcinoma not present in specimen    Closest Margin(s) to Invasive Carcinoma:  Cannot be determined: Invasive carcinoma not present in specimen   Margin Status for DCIS:  All margins negative for DCIS    Distance from DCIS to Closest Margin:  1 mm    Closest Margin(s) to DCIS:  Superior   REGIONAL LYMPH NODES   Regional Lymph Node Status:  Not applicable (no regional lymph nodes submitted or found)   DISTANT METASTASIS   Distant Site(s) Involved:  Not applicable   PATHOLOGIC STAGE CLASSIFICATION (pTNM, AJCC 8th Edition)   Reporting of pT, pN, and (when applicable) pM categories is based on information available to the pathologist at the time the report is issued. As per the AJCC (Chapter 1, 8th Ed.) it is the managing physician's responsibility to establish the final pathologic stage based upon all pertinent information, including but potentially not limited to this pathology report.   TNM Descriptors:  Not  applicable   pT Category:  pT1a   Regional Lymph Nodes Modifier:  Not applicable   pN Category:  pN not assigned (no nodes submitted or found)   pM Category:  Not applicable - pM cannot be determined from the submitted specimen(s)   Clinical Information  Post-op Diagnosis:  Recurrent cancer of the left breast   Gross Description    1. Labeled " left breast superior areolar partial mastectomy."  Received fresh for intraoperative consultation is a 13.7 g, 0.9 cm (medial-lateral) x 2.4 cm (superior-inferior)  x 2.4 cm (anterior-posterior) cm oriented lumpectomy specimen.  The specimen is marked with 6 metallic clips with the inscriptions "cranial", "caudal", "medial", "lateral", "skin", and "deep", indicating superior, inferior, medial, lateral, anterior, and deep margins, respectively.  The specimen is inked as follows: Superior-blue Inferior-green Medial-red Lateral-yellow Deep-black Anterior-orange There is a needle localization wire protruding from lateral.  The specimen is serially sectioned from medial to lateral into 9  slices (numbered beginning medial).  Identified in slices #0-0 is a 2.7 x 0.7 x 0.5 cm hollow cavity with a dumbbell biopsy clip.  Immediately surrounding the cavity and extending through all 9 slices is diffuse hemorrhage and tan-yellow, firm discoloration consistent with biopsy site change, but no definitive mass or lesion is positively identified.  The cavity with biopsy clip has the following distances from margins: superior -0.3 cm, inferior -1.0 cm, anterior -less than 0.1 cm, posterior -1.6 cm, medial -1.4 cm, and lateral -1.0 cm.  The remaining uninvolved parenchyma is yellow and lobulated with minimal intermixed tan, firm fibrous tissue (fat fibrous tissue ratio 95:5).    Representative sections are submitted as follows: 1A, sections perpendicular to red and yellow ink representing closest approach from slice #1 and slice #9, respectively 1B, one half  of slice #3 with biopsy site change 1C-D, slice #4 bisected and submitted entirely 1E-F, slice #5 bisected and submitted entirely with cavity biopsy site change 1G-H, slice #6 bisected and submitted entirely G, cavity with closest approach to blue, orange and green ink              H, cavity closest approach to black ink 1I-J, slice #7 bisected and submitted entirely I, biopsy clip retrieval site 1K-L, slice #8 bisected and submitted entirely  Total time in formalin approximately 23.5 hours  2. Labeled "left breast anterior margin side facing away from [place] represents final anterior margin".  Received in formalin is a 2.5 x 2.3 x 0.6 cm fragments of fibroadipose tissue.  The specimen is received labeled "left breast anterior margin side facing away from [place] represents final anterior margin"; however, the specimen is oriented by the surgeon in the laboratory and the new margin is inked blue.  The opposite surface is inked black.  Serial sectioning reveals yellow, lobulated cut surfaces with intermixed tan firm fibrous tissue.  No grossly apparent masses or lesions are positively identified.  The specimen is submitted entirely in 4 cassettes.  Disclaimer    The above diagnosis is based on performance of thorough gross and/or microscopic evaluations. Immunoperoxidase procedures were developed and performance characteristics determined by [facility]. Not all have been cleared or approved by the U.S. Food and Drug Administration.      For Flow Cytometry and Class I analyte-specific reagents (ASRs): This test was developed and its performance characteristics determined by [facility]. It has not been cleared or approved by the [place] Food and Drug Administration. The FDA does not require this test to go through premarket FDA review. This test is used for clinical purposes. It should not be regarded as investigational or for research. This laboratory  is certified under the Clinical Laboratory Improvement Amendments (CLIA) as qualified to perform high complexity clinical laboratory testing.  [facility] CLIA Medical Director: Judithann Sauger, Galloway     ELIGIBLE FOR AVAILABLE RESEARCH PROTOCOL: No  ASSESSMENT: 75 y.o. [place] woman  (1) status post left lumpectomy and axillary lymph node dissection in 1996 for a [place] N1 invasive breast cancer, status post adjuvant chemotherapy  [most likely cyclophosphamide and doxorubicin every 3 weeks x 6], followed by adjuvant radiation, then tamoxifen for 5 years  (2) genetics testing 09/11/2020 through the [place] [facility] Panel 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)   (a) no additional deleterious mutations noted in AIP, ALK, APC, ATM, AXIN2, BAP1, BARD1, BLM, BMPR1A, BRCA1, BRCA2, BRIP1, CDC73, CDH1, CDK4, CDKN1B, CDKN2A, CHEK2, CTNNA1,  DICER1, FANCC, FH, FLCN, GALNT12, KIF1B, LZTR1, MAX, MEN1, MET, MLH1, MSH2, MSH3, MSH6, MUTYH, NBN, NF1, NF2, NTHL1, PALB2, PHOX2B, PMS2, POT1, PRKAR1A, PTCH1, PTEN, RAD51C, RAD51D, RB1, RECQL, RET, SDHA, SDHAF2, SDHB, SDHC, SDHD, SMAD4, SMARCA4, SMARCB1, SMARCE1, STK11, SUFU, TMEM127, TP53, TSC1, TSC2, VHL and XRCC2 (sequencing and deletion/duplication); EGFR, EGLN1, HOXB13, KIT, MITF, PDGFRA, POLD1, and POLE (sequencing only); EPCAM and GREM1 (deletion/duplication only).   (b) BAP1 mutations are associated with melanoma including uveal melanoma, as well as Spitz tumors and other cutaneous tumors.  Other associations are mesothelioma, and clear-cell renal cell carcinoma,  (c) Management:There are no established screening or surveillance guidelines for individuals with a pathogenic BAP1 variant, but management recommendations have recently been proposed (PMID: 81448185):   yearly renal ultrasound, abdominal MRI every 2 years (Dr Jana Hakim)   annual dilated eye examinations with ophthalmic imaging by an  ocular oncologist  (Dr Clydene Laming)  annual dermatological full-body skin examinations (Dr Hilgers Majestic)  Annual chest x-ray alternating with low-dose screening CT scan of the chest Cancer risks Principal Features Estimated lifetime risk  Uveal melanoma ~31%  Malignant mesothelioma ~22%  Melanoma ~13%  Renal cell carcinoma ~10%  Basal cell carcinoma Unknown     (3) left breast upper outer quadrant biopsy 09/04/2020 shows 3 foci of invasive ductal carcinoma, grade 1,  largest 0.15 cm, estrogen receptor strongly positive, progesterone receptor and HER2 negative, with an MIB-1 of 2% = pT1a cN0, stage IA  (4)  lumpectomy with no sentinel lymph node sampling 09/19/2020 shows ductal carcinoma in situ with close but negative margins  (no residual invasive disease)  (a) note history of prior radiation to the left breast in 1996  (5) anastrozole started 10/29/2020  (a) osteoporosis documented March 2022 Chalmers Cater)  (b) alendronate started March 2022  (c) to start zoledronate/Reclast June 2022 Darron Doom)    PLAN: Kanesha did very well with her surgery.  She is now ready to start anastrozole.  We discussed the possible toxicities side effects and complications of this agent and I placed the order for her.  She already has osteoporosis.  She has been started on alendronate.  Her osteoporosis is followed by Dr. Chalmers Cater and she will benefit from a repeat bone density next year.  We discussed the implications of her deleterious mutation.  She already has excellent ophthalmologic and dermatologic screening.  We are going to add renal cell and mesothelioma screening to that and those orders have been entered as well  Of course she will continue to need yearly mammography and a yearly breast exam  At this point she feels very reassured by this discussion.  We are entering the appropriate orders and she will return to see me in 2 months.  She knows to call for any other issues that may develop before then  Total  encounter time 50 minutes.Sarajane Galloway C. Rana Adorno, Galloway 10/26/2020 6:49 PM Medical Oncology and Hematology [facility] [place], [place] [address] Tel. [phone]    Fax. [phone]   This document serves as a record of services personally performed by Lurline Del, Galloway. It was created on his behalf by Wilburn Mylar, a trained medical scribe. The creation of this record is based on the scribe's personal observations and the provider's statements to them.   I, Lurline Del Galloway, have reviewed the above documentation for accuracy and completeness, and I agree with the above.    *Total Encounter Time as defined by the Centers for Medicare and Medicaid Services includes,  in addition to the face-to-face time of a patient visit (documented in the note above) non-face-to-face time: obtaining and reviewing outside history, ordering and reviewing medications, tests or procedures, care coordination (communications with other health care professionals or caregivers) and documentation in the medical record.

## 2020-10-27 ENCOUNTER — Telehealth: Payer: Self-pay | Admitting: Oncology

## 2020-10-27 NOTE — Telephone Encounter (Signed)
Scheduled per 5/12 los. Called pt and left a msg  

## 2020-11-03 ENCOUNTER — Encounter: Payer: Self-pay | Admitting: Gastroenterology

## 2020-11-24 ENCOUNTER — Ambulatory Visit (HOSPITAL_COMMUNITY)
Admission: RE | Admit: 2020-11-24 | Discharge: 2020-11-24 | Disposition: A | Discharge status: Home / Self Care | Payer: Medicare Other | Source: Ambulatory Visit | Attending: Oncology | Admitting: Oncology

## 2020-11-24 ENCOUNTER — Other Ambulatory Visit: Payer: Self-pay

## 2020-11-24 DIAGNOSIS — Z1379 Encounter for other screening for genetic and chromosomal anomalies: Secondary | ICD-10-CM | POA: Diagnosis present

## 2020-11-24 DIAGNOSIS — Z170 Estrogen receptor positive status [ER+]: Secondary | ICD-10-CM | POA: Insufficient documentation

## 2020-11-24 DIAGNOSIS — C50412 Malignant neoplasm of upper-outer quadrant of left female breast: Secondary | ICD-10-CM

## 2020-11-24 DIAGNOSIS — N281 Cyst of kidney, acquired: Secondary | ICD-10-CM | POA: Diagnosis not present

## 2020-11-27 ENCOUNTER — Telehealth: Payer: Self-pay

## 2020-11-27 NOTE — Telephone Encounter (Signed)
-----   Message from Gardenia Phlegm, NP sent at 11/27/2020  1:17 PM EDT ----- Normal xray please notify patient. ----- Message ----- From: Buel Ream, Rad Results In Sent: 11/26/2020  10:59 PM EDT To: Chauncey Cruel, MD

## 2020-11-27 NOTE — Telephone Encounter (Signed)
Results reviewed with pt. All questions addressed. Pt verbalized thanks and understanding.

## 2020-12-19 ENCOUNTER — Other Ambulatory Visit: Payer: Self-pay | Admitting: *Deleted

## 2020-12-25 ENCOUNTER — Other Ambulatory Visit: Payer: Self-pay | Admitting: *Deleted

## 2020-12-25 ENCOUNTER — Other Ambulatory Visit: Payer: Self-pay

## 2020-12-25 DIAGNOSIS — C50412 Malignant neoplasm of upper-outer quadrant of left female breast: Secondary | ICD-10-CM

## 2020-12-25 DIAGNOSIS — Z170 Estrogen receptor positive status [ER+]: Secondary | ICD-10-CM

## 2020-12-26 ENCOUNTER — Other Ambulatory Visit: Payer: Self-pay

## 2020-12-26 ENCOUNTER — Inpatient Hospital Stay: Payer: Medicare Other | Attending: Genetic Counselor

## 2020-12-26 DIAGNOSIS — E039 Hypothyroidism, unspecified: Secondary | ICD-10-CM | POA: Diagnosis not present

## 2020-12-26 DIAGNOSIS — Z148 Genetic carrier of other disease: Secondary | ICD-10-CM | POA: Insufficient documentation

## 2020-12-26 DIAGNOSIS — M810 Age-related osteoporosis without current pathological fracture: Secondary | ICD-10-CM | POA: Diagnosis not present

## 2020-12-26 DIAGNOSIS — Z923 Personal history of irradiation: Secondary | ICD-10-CM | POA: Insufficient documentation

## 2020-12-26 DIAGNOSIS — Z85828 Personal history of other malignant neoplasm of skin: Secondary | ICD-10-CM | POA: Insufficient documentation

## 2020-12-26 DIAGNOSIS — Z170 Estrogen receptor positive status [ER+]: Secondary | ICD-10-CM | POA: Insufficient documentation

## 2020-12-26 DIAGNOSIS — C50412 Malignant neoplasm of upper-outer quadrant of left female breast: Secondary | ICD-10-CM | POA: Diagnosis present

## 2020-12-26 DIAGNOSIS — Z79899 Other long term (current) drug therapy: Secondary | ICD-10-CM | POA: Insufficient documentation

## 2020-12-26 DIAGNOSIS — Z79811 Long term (current) use of aromatase inhibitors: Secondary | ICD-10-CM | POA: Diagnosis not present

## 2020-12-26 DIAGNOSIS — Z9221 Personal history of antineoplastic chemotherapy: Secondary | ICD-10-CM | POA: Diagnosis not present

## 2020-12-26 DIAGNOSIS — I10 Essential (primary) hypertension: Secondary | ICD-10-CM | POA: Diagnosis not present

## 2020-12-26 LAB — CBC WITH DIFFERENTIAL (CANCER CENTER ONLY)
Abs Immature Granulocytes: 0.01 10*3/uL (ref 0.00–0.07)
Basophils Absolute: 0 10*3/uL (ref 0.0–0.1)
Basophils Relative: 1 %
Eosinophils Absolute: 0.3 10*3/uL (ref 0.0–0.5)
Eosinophils Relative: 6 %
HCT: 36.3 % (ref 36.0–46.0)
Hemoglobin: 12.2 g/dL (ref 12.0–15.0)
Immature Granulocytes: 0 %
Lymphocytes Relative: 37 %
Lymphs Abs: 1.8 10*3/uL (ref 0.7–4.0)
MCH: 30.5 pg (ref 26.0–34.0)
MCHC: 33.6 g/dL (ref 30.0–36.0)
MCV: 90.8 fL (ref 80.0–100.0)
Monocytes Absolute: 0.7 10*3/uL (ref 0.1–1.0)
Monocytes Relative: 15 %
Neutro Abs: 2 10*3/uL (ref 1.7–7.7)
Neutrophils Relative %: 41 %
Platelet Count: 308 10*3/uL (ref 150–400)
RBC: 4 MIL/uL (ref 3.87–5.11)
RDW: 13.2 % (ref 11.5–15.5)
WBC Count: 4.8 10*3/uL (ref 4.0–10.5)
nRBC: 0 % (ref 0.0–0.2)

## 2020-12-26 LAB — CMP (CANCER CENTER ONLY)
ALT: 12 U/L (ref 0–44)
AST: 20 U/L (ref 15–41)
Albumin: 3.9 g/dL (ref 3.5–5.0)
Alkaline Phosphatase: 69 U/L (ref 38–126)
Anion gap: 9 (ref 5–15)
BUN: 12 mg/dL (ref 8–23)
CO2: 28 mmol/L (ref 22–32)
Calcium: 9.7 mg/dL (ref 8.9–10.3)
Chloride: 103 mmol/L (ref 98–111)
Creatinine: 0.82 mg/dL (ref 0.44–1.00)
GFR, Estimated: >60 mL/min (ref >60)
Glucose, Bld: 85 mg/dL (ref 70–99)
Potassium: 3.9 mmol/L (ref 3.5–5.1)
Sodium: 140 mmol/L (ref 135–145)
Total Bilirubin: 0.4 mg/dL (ref 0.3–1.2)
Total Protein: 7.3 g/dL (ref 6.5–8.1)

## 2020-12-26 NOTE — Progress Notes (Signed)
[facility]  Telephone:(336) [phone] Fax:(336) [phone]     ID: [name] DOB: 1946-02-10  MR#: [id number]  MZT#:[id number]  Patient Care Team: Hayden Rasmussen, MD as PCP - General (Family Medicine) Mauro Kaufmann, RN as Oncology Nurse Navigator Rockwell Germany, RN as Oncology Nurse Navigator Erroll Luna, MD as Consulting Physician (General Surgery) Thoren Hosang, Virgie Dad, MD as Consulting Physician (Oncology) Eppie Gibson, MD as Attending Physician (Radiation Oncology) Melvenia Beam, MD as Consulting Physician (Neurology) Keene Breath., MD (Ophthalmology) Jacelyn Pi, MD as Referring Physician (Endocrinology) Ulla Gallo, MD as Consulting Physician (Dermatology) Chauncey Cruel, MD OTHER MD:  CHIEF COMPLAINT: Estrogen receptor positive breast cancer; BAP1 mutation  CURRENT TREATMENT: Anastrozole   INTERVAL HISTORY: Sandra Galloway returns today for follow up of her new left breast cancer.   She was started on anastrozole at her last visit on 10/26/2020.  She is tolerating this moderately well.  She does have mild hot flashes and she had some fatigue at first but all the symptoms have subsided.  She has had no joint pain.  She has some vaginal dryness issues.  These are not new.  She has had no arthralgias or myalgias.  Overall she feels she can easily continue this medication for 5 years  Since her last visit, she underwent screening renal ultrasound on 11/24/2020 to screen for renal cancer showing: small bilateral renal cysts; right hepatic lobe cyst; no other abnormalities.   REVIEW OF SYSTEMS: Preslee does a lot of walking for exercise.  She is planning to participate in the [place] program at the [facility]: fully vaccinated [employer]), with boosters x2 as of July 2022   HISTORY OF CURRENT ILLNESS: From the original intake note:  Sandra Galloway has a history of left breast cancer in 1996. She was treated by Dr. Malachy Mood with  lumpectomy, axillary lymph node dissection (3 out of 10 removed lymph nodes were positive), chemotherapy (likely cyclophosphamide and doxorubicin every 3 weeks x 6 from her description), followed by radiation and then tamoxifen for 5 years.  She had routine screening mammography on 08/07/2020 showing a possible abnormality in the left breast. She underwent left diagnostic mammography with tomography at [facility] on 08/14/2020 showing: breast density category B; 0.5 cm grouped punctate calcifications in upper-outer left breast.  Accordingly on 09/04/2020 she proceeded to biopsy of the left breast area in question. The pathology from this procedure ([id number]) showed: invasive ductal carcinoma, three foci, grade 1; ductal carcinoma in situ. Prognostic indicators significant for: estrogen receptor, 100% positive with strong staining intensity and progesterone receptor, 0% negative. Proliferation marker Ki67 at 2%. HER2 negative by immunohistochemistry (0).  Cancer Staging Malignant neoplasm of upper-outer quadrant of left breast in female, estrogen receptor positive ([place]) Staging form: Breast, AJCC 8th Edition - Clinical: Stage IA (cT1a, cN0, cM0, G1, ER+, PR-, HER2-) - Signed by Chauncey Cruel, MD on 09/13/2020 Stage prefix: Initial diagnosis Histologic grading system: 3 grade system  The patient's subsequent history is as detailed below.   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   Personal history of breast cancer 08/21/2020   Varicose veins     PAST SURGICAL HISTORY: Past Surgical History:  Procedure Laterality Date   BREAST LUMPECTOMY  1996   right breast   BREAST LUMPECTOMY  09/19/2020   COLONOSCOPY  2016   KN-MAC-pre p good-TICS-2 TA/SSP   COLONOSCOPY  10/17/2020   KN   ENDOVENOUS ABLATION SAPHENOUS VEIN W/ LASER Left 07-22-2013   left greater saphenous vein and sclerotherapy (left leg)  Curt Jews MD    ENDOVENOUS ABLATION SAPHENOUS VEIN W/ LASER Right 09-02-2013   right greater saphenous vein and sclerotherapy right leg  by Curt Jews MD   POLYPECTOMY  2016   TA/SSP   TUBAL LIGATION  1984    FAMILY HISTORY: Family History  Problem Relation Age of Onset   Other Mother        varicose veins   Colon cancer Mother 70   Dementia Mother    Colon polyps Mother 51   Heart disease Father    Hyperlipidemia Father    Heart attack Father    Cancer Father        unknown primary; ? pancreatic   Skin cancer Father    Breast cancer Sister        Ages 28, 2 (two primaries)   Colon polyps Sister 73   Endometrial cancer Maternal Aunt 35   Ovarian cancer Paternal Aunt 47   Ovarian cysts Paternal Aunt    Cancer - Ovarian Paternal Aunt    Breast cancer Cousin 46   Cervical cancer Daughter 55   Ovarian cancer Paternal Aunt 28   Pancreatic cancer Paternal Aunt 89   Breast cancer Cousin 63   Colon polyps Brother 53   Esophageal cancer Neg Hx    Stomach cancer Neg Hx    Rectal cancer Neg Hx    Her father died at age 46 from pancreatic cancer. Her mother died at age 6 and had a history of colon cancer at age 65. Aricela has 2 brothers and 2 sisters. She also reports breast cancer in a sister (Bev, who is also my patient) at age 19 with recurrence at age 71, ovarian cancer in two paternal aunts, cervical cancer in her daughter at age 53, and an unknown cancer in a maternal  aunt.   GYNECOLOGIC HISTORY:  No LMP recorded. Patient is postmenopausal. Menarche: 75 years old Age at first live birth: 75 years old [place] P 3 LMP 26 Contraceptive never used HRT never used  Hysterectomy? no BSO? 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 Husband Axie Hayne is retired from working as Development worker, international aid of the career center at [employer] [place]. Daughter Otelia Limes, age 62, is the owner of a [employer] and lives in [place]. Daughter Jethro Bastos, age 29, is a Ship broker" and lives in [place]. Daughter Gavina Dildine, age 12, is a Education officer, museum in [place]. The patient has 8 grandchildren. She attends [employer]).    ADVANCED DIRECTIVES: in place   HEALTH MAINTENANCE:  Social History   Tobacco Use   Smoking status: Never   Smokeless tobacco: Never  Vaping Use   Vaping Use: Never used  Substance Use Topics   Alcohol use: Yes    Alcohol/week: 1.0 standard drink    Types: 1 Glasses of wine per week    Comment: 1 per month   Drug use: No     Colonoscopy: 10/17/2020 (Nandigam).  PAP: 2020  Bone density: 08/2020   Allergies  Allergen Reactions   Amoxicillin     REACTION: rash    Current Outpatient Medications  Medication Sig Dispense Refill   alendronate (FOSAMAX) 70 MG tablet Take 1 tablet by mouth once a week.     anastrozole (ARIMIDEX) 1 MG tablet Take 1 tablet (1 mg total) by mouth daily. 90 tablet 4   CALCIUM-VITAMIN D PO Take 1 tablet by mouth 2 (two) times daily.     ergocalciferol (VITAMIN D2) 1.25 MG (50000 UT) capsule Take 50,000 Units by mouth once a week.     levothyroxine (SYNTHROID) 75 MCG tablet synthroid  75 mcg tabs     losartan-hydrochlorothiazide (HYZAAR) 50-12.5 MG tablet Take 1 tablet by mouth daily.     pravastatin (PRAVACHOL) 80 MG tablet Take 80 mg by mouth daily.     Sod Citrate-Citric Acid (CITRIC ACID-SODIUM CITRATE PO)  Take by mouth daily as needed.     No current facility-administered medications for this visit.    OBJECTIVE: White woman who appears younger than stated age  24:   12/27/20 1049  BP: 113/77  Pulse: 82  Resp: 18  Temp: 97.8 F (36.6 C)  SpO2: 98%      Body mass index is 25.08 kg/m.   Wt Readings from Last 3 Encounters:  12/27/20 146 lb 1.6 oz (66.3 kg)  10/26/20 146 lb 4.8 oz (66.4 kg)  10/17/20 147 lb (66.7 kg)      ECOG FS:1 - Symptomatic but completely ambulatory  Sclerae unicteric, EOMs intact Wearing a mask No cervical or supraclavicular adenopathy Lungs no rales or rhonchi Heart regular rate and rhythm Abd soft, nontender, positive bowel sounds MSK no focal spinal tenderness, no upper extremity lymphedema Neuro: nonfocal, well oriented, appropriate affect Breasts: The right breast is unremarkable.  The left breast is status post remote lumpectomy and radiation, and second lumpectomy more recently.  She has also undergone left axillary lymph node dissection.  There is no evidence of residual or recurrent disease.   LAB RESULTS:  CMP     Component Value Date/Time   NA 140 12/26/2020 0823   K 3.9 12/26/2020 0823   CL 103 12/26/2020 0823   CO2 28 12/26/2020 0823   GLUCOSE 85 12/26/2020 0823   BUN 12 12/26/2020 0823   CREATININE 0.82 12/26/2020 0823   CALCIUM 9.7 12/26/2020 0823   PROT 7.3 12/26/2020 0823   ALBUMIN 3.9 12/26/2020 0823   AST 20 12/26/2020 0823   ALT 12 12/26/2020 0823   ALKPHOS 69 12/26/2020 0823   BILITOT 0.4 12/26/2020 0823   GFRNONAA >60 12/26/2020 0823    No results found for: TOTALPROTELP, ALBUMINELP, A1GS, A2GS, BETS, BETA2SER, GAMS, MSPIKE, SPEI  Lab Results  Component Value Date   WBC 4.8 12/26/2020   NEUTROABS 2.0 12/26/2020   HGB 12.2 12/26/2020   HCT 36.3 12/26/2020   MCV 90.8 12/26/2020   PLT 308 12/26/2020    No results found for: LABCA2  No components found for: [id number]  No results for input(s): INR in the  last 168 hours.  No results found for: LABCA2  No results found for: KYH062  No results found for: BJS283  No results found for: TDV761  No results found for: CA2729  No components found for: HGQUANT  No results found for: CEA1 / No results found for: CEA1   No results found for: AFPTUMOR  No results found for: CHROMOGRNA  No results found for: KPAFRELGTCHN, LAMBDASER, KAPLAMBRATIO (kappa/lambda light chains)  No results found for: HGBA, HGBA2QUANT, HGBFQUANT, HGBSQUAN (Hemoglobinopathy evaluation)   No results found for: LDH  No results found for: IRON, TIBC, IRONPCTSAT (Iron and TIBC)  No results found for: FERRITIN  Urinalysis No results found for: COLORURINE, APPEARANCEUR, LABSPEC, PHURINE, GLUCOSEU, HGBUR, BILIRUBINUR, KETONESUR, PROTEINUR, UROBILINOGEN, NITRITE, LEUKOCYTESUR   STUDIES: No results found.   ELIGIBLE FOR AVAILABLE RESEARCH PROTOCOL: No  ASSESSMENT: 75 y.o. [place] woman  (1) status post left lumpectomy and axillary lymph node dissection in 1996 for a [place] N1 invasive breast cancer, status post adjuvant chemotherapy  [most likely cyclophosphamide and doxorubicin every 3 weeks x 6], followed by adjuvant radiation, then tamoxifen for 5 years  (2) genetics testing 09/11/2020 through the [place] [facility]RNA[facility] Panel 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)   (a) no additional deleterious mutations noted in AIP, ALK, APC, ATM, AXIN2, BAP1, BARD1, BLM, BMPR1A, BRCA1, BRCA2, BRIP1, CDC73, CDH1, CDK4, CDKN1B, CDKN2A, CHEK2, CTNNA1, DICER1, FANCC, FH, FLCN, GALNT12, KIF1B, LZTR1, MAX, MEN1, MET, MLH1, MSH2, MSH3, MSH6, MUTYH, NBN, NF1, NF2, NTHL1, PALB2, PHOX2B, PMS2, POT1, PRKAR1A, PTCH1, PTEN, RAD51C, RAD51D, RB1, RECQL, RET, SDHA, SDHAF2, SDHB, SDHC, SDHD, SMAD4, SMARCA4, SMARCB1, SMARCE1, STK11, SUFU, TMEM127, TP53, TSC1, TSC2, VHL and XRCC2 (sequencing and deletion/duplication); EGFR, EGLN1, HOXB13, KIT, MITF,  PDGFRA, POLD1, and POLE (sequencing only); EPCAM and GREM1 (deletion/duplication only).   (b) BAP1 mutations are associated with melanoma including uveal melanoma, as well as Spitz tumors and other cutaneous tumors.  Other associations are mesothelioma, and clear-cell renal cell carcinoma,  (c) Management:There are no established screening or surveillance guidelines for individuals with a pathogenic BAP1 variant, but management recommendations have recently been proposed (PMID: 60737106):  yearly renal ultrasound, abdominal MRI every 2 years (Dr Jana Hakim)  annual dilated eye examinations with ophthalmic imaging by an ocular oncologist  (Dr Clydene Laming) annual dermatological full-body skin examinations (Dr Rothman Majestic) Annual chest x-ray alternating with low-dose screening CT scan of the chest (Raaga Maeder)  Cancer risks Principal Features Estimated lifetime risk  Uveal melanoma ~31%  Malignant mesothelioma ~22%  Melanoma ~13%  Renal cell carcinoma ~10%  Basal cell carcinoma Unknown     (3) left breast upper outer quadrant biopsy 09/04/2020 shows 3 foci of invasive ductal carcinoma, grade 1,  largest 0.15 cm, estrogen receptor strongly positive, progesterone receptor and HER2 negative, with an MIB-1 of 2% = pT1a cN0, stage IA  (4)  lumpectomy with no sentinel lymph node sampling 09/19/2020 shows ductal carcinoma in situ with close but negative margins  (no residual invasive disease)  (a) note history of prior radiation to the left breast in 1996  (5) anastrozole started 10/29/2020  (a) osteoporosis documented March 2022 [facility])  (b) alendronate started March 2022  (c) to start zoledronate/Reclast June 2022 [facility])   PLAN: Sherman is tolerating anastrozole well and the plan will be to continue that a total of 5 years.  She does have osteoporosis, is currently on alendronate and is scheduled to switch to Reclast later this year under her primary care physician  Aside from following her  for the breast  cancer we are part of the monitoring for her BAP1 mutation.  She just had a renal ultrasound and this will be repeated in a year.  She had a chest x-ray this June and will have a chest CT June 2023.  I have entered the appropriate orders.  She will then return to see [place] July 2023.  She knows to call for any other issue that may develop before then.  Total encounter time 25 minutes.Sarajane Jews C. Courtny Bennison, MD 12/27/2020 7:36 PM Medical Oncology and Hematology [facility] [place], [place] [address] Tel. [phone]    Fax. [phone]   This document serves as a record of services personally performed by Lurline Del, MD. It was created on his behalf by Wilburn Mylar, a trained medical scribe. The creation of this record is based on the scribe's personal observations and the provider's statements to them.   I, Lurline Del MD, have reviewed the above documentation for accuracy and completeness, and I agree with the above.   *Total Encounter Time as defined by the Centers for Medicare and Medicaid Services includes, in addition to the face-to-face time of a patient visit (documented in the note above) non-face-to-face time: obtaining and reviewing outside history, ordering and reviewing medications, tests or procedures, care coordination (communications with other health care professionals or caregivers) and documentation in the medical record.

## 2020-12-27 ENCOUNTER — Inpatient Hospital Stay (HOSPITAL_BASED_OUTPATIENT_CLINIC_OR_DEPARTMENT_OTHER): Payer: Medicare Other | Admitting: Oncology

## 2020-12-27 VITALS — BP 113/77 | HR 82 | Temp 97.8°F | Resp 18 | Ht 64.0 in | Wt 146.1 lb

## 2020-12-27 DIAGNOSIS — C50412 Malignant neoplasm of upper-outer quadrant of left female breast: Secondary | ICD-10-CM | POA: Diagnosis not present

## 2020-12-27 DIAGNOSIS — Z170 Estrogen receptor positive status [ER+]: Secondary | ICD-10-CM

## 2021-01-23 ENCOUNTER — Encounter (INDEPENDENT_AMBULATORY_CARE_PROVIDER_SITE_OTHER): Payer: Medicare Other

## 2021-01-23 ENCOUNTER — Other Ambulatory Visit: Payer: Self-pay

## 2021-01-23 DIAGNOSIS — M7989 Other specified soft tissue disorders: Secondary | ICD-10-CM

## 2021-07-19 IMAGING — US US RENAL
1 series · 14 of 25 positions shown · non-contrast
Comparison: None.

CLINICAL DATA: Screening for renal cancer. Mutation positive
patient.

EXAM:
RENAL / URINARY TRACT ULTRASOUND COMPLETE

[Series 1: us renal · 14 of 52 slices shown]
[im 1/52]
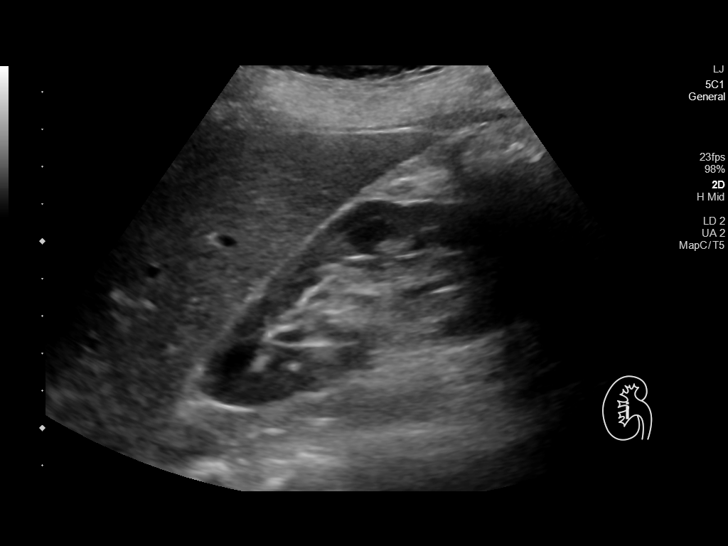
[im 5/52]
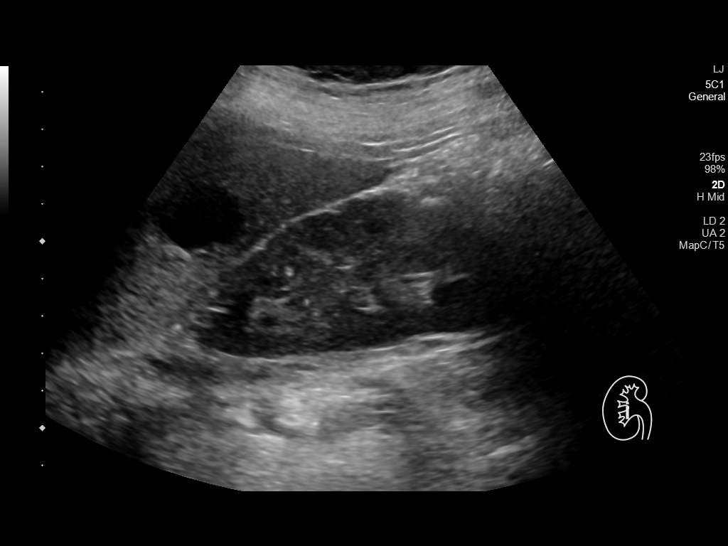
[im 9/52]
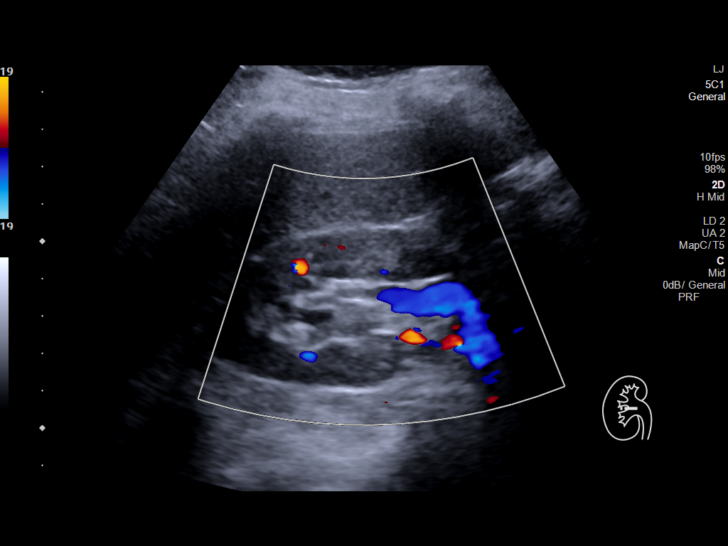
[im 13/52]
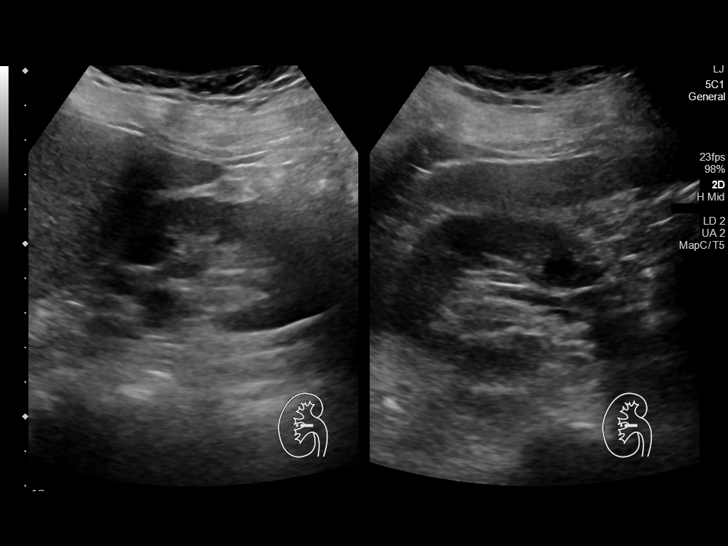
[im 18/52]
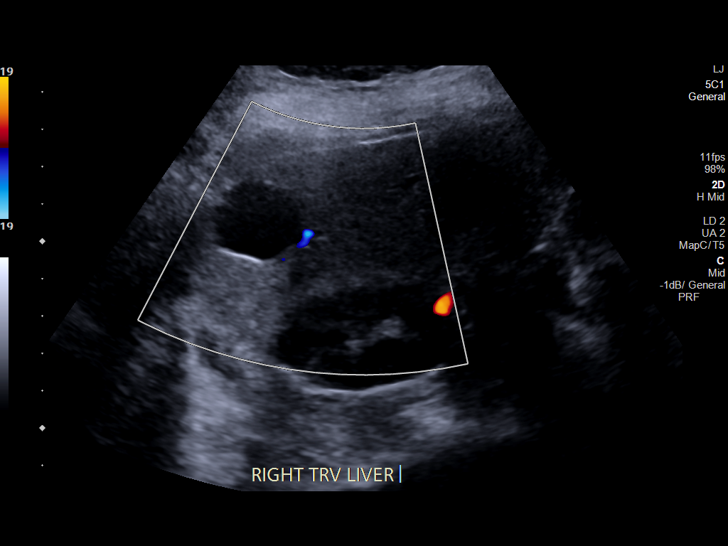
[im 20/52]
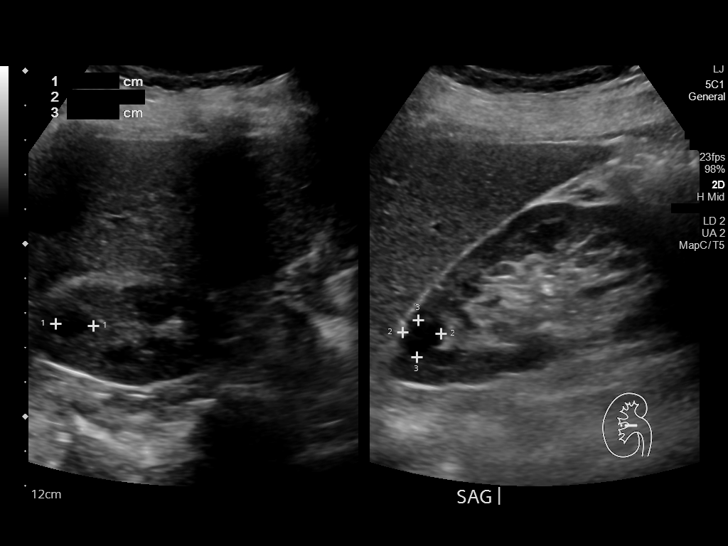
[im 24/52]
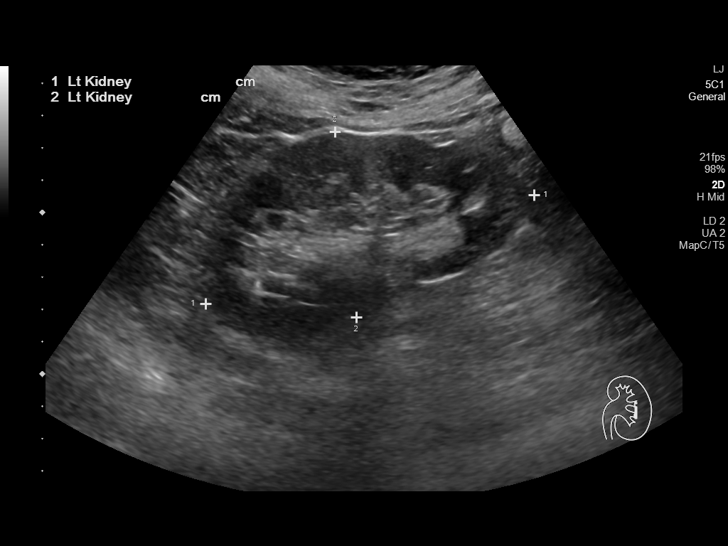
[im 28/52]
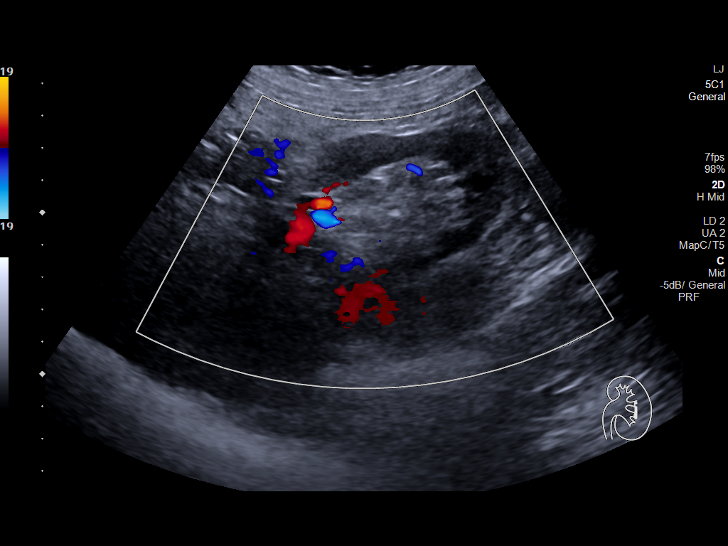
[im 32/52]
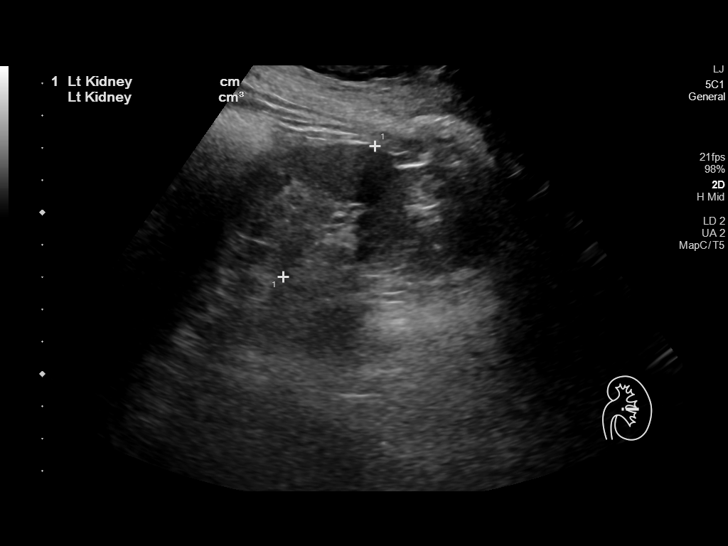
[im 35/52]
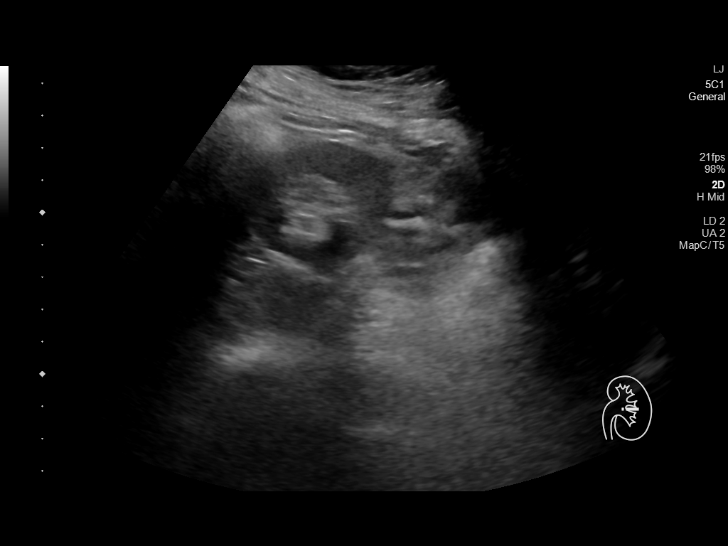
[im 39/52]
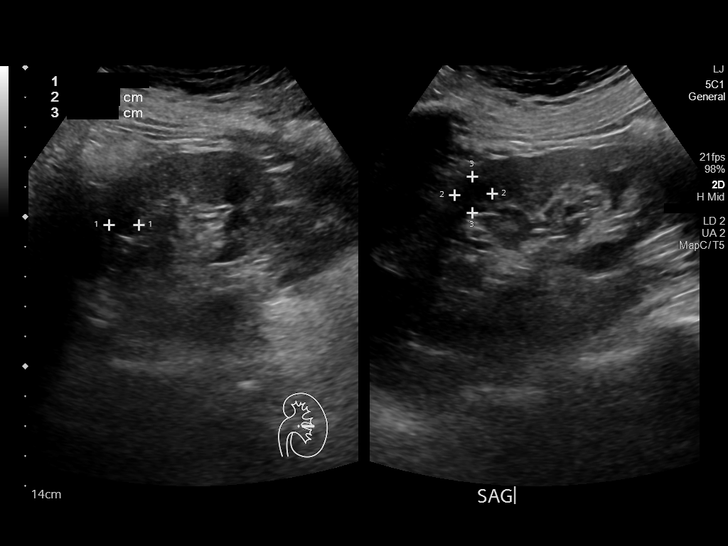
[im 43/52]
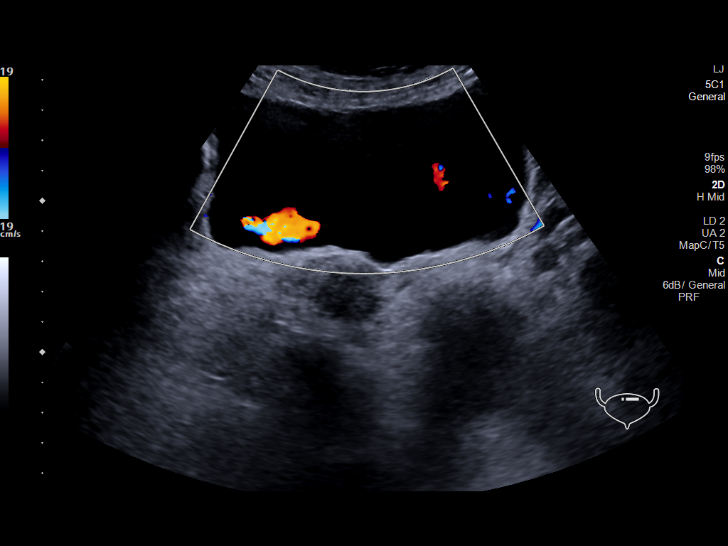
[im 47/52]
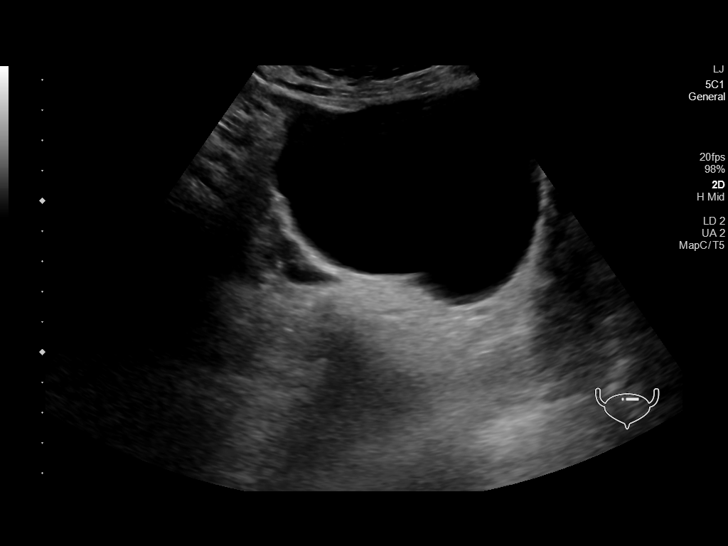
[im 52/52]
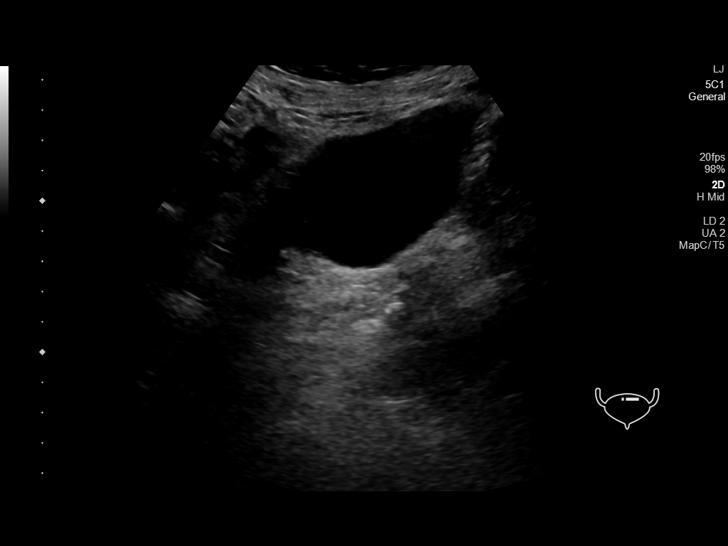

[14 of 25 positions shown; findings below may reference images not displayed]

FINDINGS: Right Kidney:

Renal measurements: 10 x 4.5 x 6.3 cm = volume: 150 mL. Contains 2
cysts with the largest measuring 1.5 cm.

Left Kidney:

Renal measurements: 10.7 x 5.8 x 5.0 cm = volume: 160.4 mL. Contains
a 1.3 cm cyst.

Bladder:

Appears normal for degree of bladder distention.

Other:

2.4 cm cyst in the right hepatic lobe.
IMPRESSION: Small bilateral renal cysts. Right hepatic lobe cyst. No other
abnormalities.

## 2021-08-01 ENCOUNTER — Ambulatory Visit (HOSPITAL_BASED_OUTPATIENT_CLINIC_OR_DEPARTMENT_OTHER): Payer: Medicare Other | Admitting: Cardiology

## 2021-08-17 ENCOUNTER — Ambulatory Visit (INDEPENDENT_AMBULATORY_CARE_PROVIDER_SITE_OTHER): Payer: Medicare Other | Admitting: Internal Medicine

## 2021-08-17 ENCOUNTER — Other Ambulatory Visit: Payer: Self-pay

## 2021-08-17 ENCOUNTER — Encounter: Payer: Self-pay | Admitting: Internal Medicine

## 2021-08-17 VITALS — BP 118/78 | HR 67 | Ht 64.0 in | Wt 144.8 lb

## 2021-08-17 DIAGNOSIS — M542 Cervicalgia: Secondary | ICD-10-CM | POA: Diagnosis not present

## 2021-08-17 NOTE — Patient Instructions (Signed)
Medication Instructions:  ?Your physician recommends that you continue on your current medications as directed. Please refer to the Current Medication list given to you today. ? ?Follow-Up: ?At [facility], you and your health needs are our priority.  As part of our continuing mission to provide you with exceptional heart care, we have created designated Provider Care Teams.  These Care Teams include your primary Cardiologist (physician) and Advanced Practice Providers (APPs -  Physician Assistants and Nurse Practitioners) who all work together to provide you with the care you need, when you need it. ? ?We recommend signing up for the patient portal called "MyChart".  Sign up information is provided on this After Visit Summary.  MyChart is used to connect with patients for Virtual Visits (Telemedicine).  Patients are able to view lab/test results, encounter notes, upcoming appointments, etc.  Non-urgent messages can be sent to your provider as well.   ?To learn more about what you can do with MyChart, go to [email or web address].   ? ?Your next appointment:   ?AS NEEDED with Dr. Harl Bowie  ? ? ?

## 2021-08-17 NOTE — Progress Notes (Signed)
?Cardiology Office Note:   ? ?Date:  08/17/2021  ? ?ID:  Sandra Galloway, DOB May 22, 1946, MRN [id number] ? ?PCP:  Hayden Rasmussen, MD ?  ?[facility] Providers ?Cardiologist:  None    ? ?Referring MD: Hayden Rasmussen, MD  ? ?No chief complaint on file. ?Neck pain ? ?History of Present Illness:   ? ?Sandra Galloway is a 76 y.o. female with a hx of left sided breast cancer ER+ on anastrozole (start 10/26/2020) ? ? ?She notes R neck pain. It's 3-4/10. It lasts for a minute or so. It has happened 4x per week. She is concerned about her carotids.  She takes pravastatin 80 mg daily. LDL 86 mg/dL.  She denies history of stroke. She does not have cardiac disease history. She had a stress test in the past and that was normal. She has leg cramping at night.. She denies chest pain or shortness of breath. She denies LH, dizziness, or syncope. Non smoker. Father - CABG at 81, lived to 44. No heart disease in her mother.  ? ? ?Breast cancer hx ?-Started 1996 ?lumpectomy, axillary lymph node dissection (3 out of 10 removed lymph nodes were positive), chemotherapy (likely cyclophosphamide and doxorubicin every 3 weeks x 6 from her description), followed by radiation and then tamoxifen for 5 years. ?-She had mammogram showing possible L breast abnormality 08/07/2020 found to be invasive ductal carcinoma, three foci, grade 1; ductal carcinoma in situ. ER+/Her2- ? ?Past Medical History:  ?Diagnosis Date  ? Allergic rhinitis   ? 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 Basal cell carcinoma (BCC) of skin of nose   ? Breast cancer ([place])   ? LEFT  ? Cancer [facility])   ? LEFT breast  ? Cataract   ? RIGHT eye-not a surgical candidate at this time-  ? Effusion, right knee   ? Essential (primary) hypertension   ? on meds  ? Family history of breast cancer 08/21/2020  ? Family history of colon cancer 08/21/2020  ? Family history of pancreatic cancer 08/21/2020  ? Family history of uterine cancer 08/21/2020  ? Hyperlipidemia   ? on meds  ? Hypothyroidism   ? on meds  ?  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 Personal history of breast cancer 08/21/2020  ? Varicose veins   ? ? ?Past Surgical History:  ?Procedure Laterality Date  ? BREAST LUMPECTOMY  1996  ? right breast  ? BREAST LUMPECTOMY  09/19/2020  ? COLONOSCOPY  2016  ? KN-MAC-pre p good-TICS-2 TA/SSP  ? COLONOSCOPY  10/17/2020  ? KN  ? ENDOVENOUS ABLATION SAPHENOUS VEIN W/ LASER Left 07-22-2013  ? left greater saphenous vein and sclerotherapy (left leg)  Curt Jews MD   ? ENDOVENOUS ABLATION SAPHENOUS VEIN W/ LASER Right 09-02-2013  ? right greater saphenous vein and sclerotherapy right leg  by Curt Jews MD  ? POLYPECTOMY  2016  ? TA/SSP  ? TUBAL LIGATION  1984  ? ? ?Current Medications: ?No outpatient medications have been marked as taking for the 08/17/21 encounter (Appointment) with Janina Mayo, MD.  ?  ? ?Allergies:   Amoxicillin  ? ?Social History  ? ?Socioeconomic History  ? Marital status: Married  ?  Spouse name: Not on file  ? Number of children: 3  ? Years of education: 58  ? Highest education level: Some college, no degree  ?Occupational History  ? Occupation: retired  ?Tobacco Use  ? Smoking status: Never  ? Smokeless tobacco: Never  ?  Vaping Use  ? Vaping Use: Never used  ?Substance and Sexual Activity  ? Alcohol use: Yes  ?  Alcohol/week: 1.0 standard drink  ?  Types: 1 Glasses of wine per week  ?  Comment: 1 per month  ? Drug use: No  ? Sexual activity: Not on file  ?Other Topics Concern  ? Not on file  ?Social History Narrative  ? Lives at home with husband in a one story home.  Has 3 children.  Retired from [employer].  Education: some college.    ? Right handed  ? Caffeine: 2 cups/day, was more  ? ?Social Determinants of Health  ? ?Financial Resource Strain: Not on file  ?Food Insecurity: No Food Insecurity  ? Worried About Charity fundraiser in the Last Year: Never true  ? Ran Out of Food in the Last Year: Never true  ?Transportation Needs: No Transportation Needs  ?  Lack of Transportation (Medical): No  ? Lack of Transportation (Non-Medical): No  ?Physical Activity: Not on file  ?Stress: Not on file  ?Social Connections: Not on file  ?  ? ?Family History: ?The patient's family history includes Breast cancer in her sister; Breast cancer (age of onset: 20) in her cousin; Breast cancer (age of onset: 55) in her cousin; Cancer in her father; Cancer - Ovarian in her paternal aunt; Cervical cancer (age of onset: 48) in her daughter; Colon cancer (age of onset: 74) in her mother; Colon polyps (age of onset: 56) in her sister; Colon polyps (age of onset: 27) in her brother; Colon polyps (age of onset: 73) in her mother; Dementia in her mother; Endometrial cancer (age of onset: 9) in her maternal aunt; Heart attack in her father; Heart disease in her father; Hyperlipidemia in her father; Other in her mother; Ovarian cancer (age of onset: 67) in her paternal aunt and paternal aunt; Ovarian cysts in her paternal aunt; Pancreatic cancer (age of onset: 34) in her paternal aunt; Skin cancer in her father. There is no history of Esophageal cancer, Stomach cancer, or Rectal cancer. ? ?ROS:   ?Please see the history of present illness.    ? All other systems reviewed and are negative. ? ?EKGs/Labs/Other Studies Reviewed:   ? ?The following studies were reviewed today: ? ? ?EKG:  EKG is  ordered today.  The ekg ordered today demonstrates  ?NSR, LAD ? ?Recent Labs: ?12/26/2020: ALT 12; BUN 12; Creatinine 0.82; Hemoglobin 12.2; Platelet Count 308; Potassium 3.9; Sodium 140  ?Recent Lipid Panel ?No results found for: CHOL, TRIG, HDL, CHOLHDL, VLDL, LDLCALC, LDLDIRECT ? ? ?Risk Assessment/Calculations:   ?  ? ?    ? ?Physical Exam:   ? ?VS:   ? ?Vitals:  ? 08/17/21 0828  ?BP: 118/78  ?Pulse: 67  ?SpO2: 98%  ? ? ? ?Wt Readings from Last 3 Encounters:  ?12/27/20 146 lb 1.6 oz (66.3 kg)  ?10/26/20 146 lb 4.8 oz (66.4 kg)  ?10/17/20 147 lb (66.7 kg)  ?  ? ?GEN:  Well nourished, well developed in no  acute distress ?HEENT: Normal ?NECK: No JVD; No carotid bruits ?LYMPHATICS: No lymphadenopathy ?CARDIAC: RRR, no murmurs, rubs, gallops ?RESPIRATORY:  Clear to auscultation without rales, wheezing or rhonchi  ?ABDOMEN: Soft, non-tender, non-distended ?MUSCULOSKELETAL:  No edema; No deformity  ?SKIN: Warm and dry ?NEUROLOGIC:  Alert and oriented x 3 ?PSYCHIATRIC:  Normal affect  ? ?ASSESSMENT:   ? ?#Neck Pain: Her neck pain is not associated with  carotid stenosis. She has low risk and no stroke hx. She has no carotid bruit on exam.  ? ?#CardioOnc- although some data states AI are associated with cardiac events; recommendation is to continue statin therapy for CVD risk reduction. ? ?PLAN:   ? ?In order of problems listed above: ? ?Follow up as needed ? ?   ? ?   ? ? ?Medication Adjustments/Labs and Tests Ordered: ?Current medicines are reviewed at length with the patient today.  Concerns regarding medicines are outlined above.  ?No orders of the defined types were placed in this encounter. ? ?No orders of the defined types were placed in this encounter. ? ? ?There are no Patient Instructions on file for this visit.  ? ?Signed, ?Janina Mayo, MD  ?08/17/2021 8:04 AM    ?[place] ?

## 2021-09-13 ENCOUNTER — Other Ambulatory Visit: Payer: Self-pay | Admitting: Endocrinology

## 2021-09-13 DIAGNOSIS — E010 Iodine-deficiency related diffuse (endemic) goiter: Secondary | ICD-10-CM

## 2021-09-24 ENCOUNTER — Other Ambulatory Visit: Payer: Medicare Other

## 2021-11-08 ENCOUNTER — Emergency Department (HOSPITAL_BASED_OUTPATIENT_CLINIC_OR_DEPARTMENT_OTHER)
Admission: EM | Admit: 2021-11-08 | Discharge: 2021-11-08 | Disposition: A | Discharge status: Home / Self Care | Payer: Medicare Other | Attending: Emergency Medicine | Admitting: Emergency Medicine

## 2021-11-08 ENCOUNTER — Encounter (HOSPITAL_BASED_OUTPATIENT_CLINIC_OR_DEPARTMENT_OTHER): Payer: Self-pay

## 2021-11-08 ENCOUNTER — Emergency Department (HOSPITAL_BASED_OUTPATIENT_CLINIC_OR_DEPARTMENT_OTHER): Payer: Medicare Other | Admitting: Radiology

## 2021-11-08 ENCOUNTER — Other Ambulatory Visit: Payer: Self-pay

## 2021-11-08 DIAGNOSIS — S9032XA Contusion of left foot, initial encounter: Secondary | ICD-10-CM

## 2021-11-08 DIAGNOSIS — W01198A Fall on same level from slipping, tripping and stumbling with subsequent striking against other object, initial encounter: Secondary | ICD-10-CM | POA: Insufficient documentation

## 2021-11-08 DIAGNOSIS — R2242 Localized swelling, mass and lump, left lower limb: Secondary | ICD-10-CM

## 2021-11-08 DIAGNOSIS — S99922A Unspecified injury of left foot, initial encounter: Secondary | ICD-10-CM | POA: Diagnosis present

## 2021-11-08 MED ORDER — ACETAMINOPHEN 500 MG PO TABS: 500.0000 mg | ORAL_TABLET | Freq: Four times a day (QID) | ORAL | 0 refills | Status: DC | PRN

## 2021-11-08 NOTE — ED Triage Notes (Signed)
Pt states that she had a mechanical fall last night and hurt her L lateral foot hitting it on the fireplace. Did not hit head, no LOC.

## 2021-11-08 NOTE — Discharge Instructions (Addendum)
The radiologist does not notice any clear evidence of fracture. As I indicated, I see clear evidence of erosion of the bone on x-ray around the site where you are hurting.  My suspicion is that this could be all related to old fracture, but given the local pain and swelling in that side, we will proceed conservatively by applying cam walker boot and providing you with crutches.  You are allowed to weight-bear as tolerated.

## 2021-11-08 NOTE — ED Provider Notes (Signed)
[facility] EMERGENC[facility] DEPT Provider Note   CSN: [id number] Arrival date & time: 11/08/21  6644     History  Chief Complaint  Patient presents with   Foot Injury    Sandra Galloway is a 76 y.o. female.  HPI     76 year old female comes in with chief complaint of foot injury. Yesterday she had a mechanical fall when she tried to walk, without noticing that her leg had gone " dead".  Patient tripped and fall, and noticed swelling and discomfort later on when she removed her compression stockings.  She continues to have some discomfort over the left foot with increased swelling and pain with ambulation.  Patient also had some bruising over her hip and injury to her hand, but she thinks they are self-limiting.  She denies any head trauma, headaches, loss of consciousness.  Patient is not on any blood thinners.   Home Medications Prior to Admission medications   Medication Sig Start Date End Date Taking? Authorizing Provider  alendronate (FOSAMAX) 70 MG tablet Take 1 tablet by mouth once a week. 08/25/20   [provider]  anastrozole (ARIMIDEX) 1 MG tablet Take 1 tablet (1 mg total) by mouth daily. 10/26/20   Magrinat, Virgie Dad, MD  CALCIUM-VITAMIN D PO Take 1 tablet by mouth 2 (two) times daily.    [provider]  cyanocobalamin (,VITAMIN B-12,) 1000 MCG/ML injection Inject 1,000 mcg into the muscle once a week. 08/03/21   [provider]  donepezil (ARICEPT) 5 MG tablet Take 5 mg by mouth at bedtime. 07/17/21   [provider]  ergocalciferol (VITAMIN D2) 1.25 MG (50000 UT) capsule Take 50,000 Units by mouth once a week.    [provider]  levothyroxine (SYNTHROID) 75 MCG tablet synthroid  75 mcg tabs    [provider]  losartan-hydrochlorothiazide (HYZAAR) 50-12.5 MG tablet Take 1 tablet by mouth daily.    [provider]  pravastatin (PRAVACHOL) 80 MG tablet Take 80 mg by mouth daily.    [provider]  Sod Citrate-Citric Acid (CITRIC ACID-SODIUM CITRATE PO) Take by mouth daily as needed.    [provider]      Allergies    Amoxicillin    Review of Systems   Review of Systems  Physical Exam Updated Vital Signs BP 126/79 (BP Location: Right Arm)   Pulse 70   Temp 98.1 F (36.7 C)   Resp 14   SpO2 97%  Physical Exam Vitals and nursing note reviewed.  Constitutional:      Appearance: She is well-developed.  HENT:     Head: Atraumatic.  Eyes:     Pupils: Pupils are equal, round, and reactive to light.  Cardiovascular:     Rate and Rhythm: Normal rate.  Pulmonary:     Effort: Pulmonary effort is normal.  Musculoskeletal:        General: Swelling and deformity present.     Cervical back: Normal range of motion and neck supple.     Comments: Patient has increased swelling over the left midfoot laterally, just proximal to the small digit  Skin:    General: Skin is warm and dry.  Neurological:     Mental Status: She is alert and oriented to person, place, and time.    ED Results / Procedures / Treatments   Labs (all labs ordered are listed, but only abnormal results are displayed) Labs Reviewed - No data to display  EKG None  Radiology DG  Foot Complete Left  Result Date: 11/08/2021 CLINICAL DATA:  Mechanical fall last night with left foot pain EXAM: LEFT FOOT - COMPLETE 3+ VIEW COMPARISON:  None Available. FINDINGS: Hallux valgus with bunion and degenerative spurring of the sesamoids. Bipartite sesamoids. Postoperative little digit interphalangeal joint. Os navicularis and intermetatarseum. Negative for fracture, dislocation, or erosive finding. IMPRESSION: No acute finding. Chronic findings are described above. Electronically Signed   By: Jorje Guild M.D.   On: 11/08/2021 07:26    Procedures Procedures    Medications Ordered in ED Medications - No data to display  ED Course/ Medical Decision Making/ A&P                            Medical Decision Making Amount and/or Complexity of Data Reviewed Radiology: ordered.  76 year old female comes in with chief complaint of fall.  As a result, she is having discomfort over her left foot and on exam there is clear evidence of swelling, in the left lateral foot.  There is focal tenderness to palpation.  Differential diagnosis includes soft tissue injury, soft tissue tear, avulsion fracture.  We ordered x-ray, I reviewed and visualized the x-ray independently.  There is clear evidence of bony abnormality over the tender area on x-ray.  My concerns are that patient likely has an avulsion fracture.  Radiology interpretation is vague, there is concern for erosion and arthritis.  Patient typically does not have foot pain, but she did previously injure/fracture of the same site.  For now, based on the local area of tenderness we will treat the symptoms conservatively and apply cam walker boot and provide crutches.  Final Clinical Impression(s) / ED Diagnoses Final diagnoses:  Contusion of left foot, initial encounter    Rx / DC Orders ED Discharge Orders     None         Varney Biles, MD 11/08/21 [phone]

## 2021-11-08 NOTE — ED Notes (Signed)
Pt discharged home with all belongings, discharge instructions provided, pt given opportunity to ask questions regarding dc instructions. No questions at this time.

## 2021-11-16 ENCOUNTER — Ambulatory Visit (INDEPENDENT_AMBULATORY_CARE_PROVIDER_SITE_OTHER): Payer: Medicare Other

## 2021-11-16 ENCOUNTER — Ambulatory Visit (INDEPENDENT_AMBULATORY_CARE_PROVIDER_SITE_OTHER): Payer: Medicare Other | Admitting: Orthopaedic Surgery

## 2021-11-16 DIAGNOSIS — M25561 Pain in right knee: Secondary | ICD-10-CM

## 2021-11-16 DIAGNOSIS — G8929 Other chronic pain: Secondary | ICD-10-CM

## 2021-11-16 DIAGNOSIS — M1711 Unilateral primary osteoarthritis, right knee: Secondary | ICD-10-CM | POA: Diagnosis not present

## 2021-11-16 NOTE — Progress Notes (Signed)
Chief Complaint: left food pain, right knee pain     History of Present Illness:    Sandra Galloway is a 76 y.o. female presents today for follow-up of her left foot. She states that she rolled her foot under her one week prior and subsequently has had bruising. She was given a boot by the ED and her pain has resolved over the course of the last week. With regard to her right knee she has had pain for the last several year which she rates as a 1.5/10 She is experience crepitus with longer walks. She is here today for further assessment. She has not had any injections or physical therpay.    Surgical History:   none  PMH/PSH/Family History/Social History/Meds/Allergies:    Past Medical History:  Diagnosis Date   Allergic rhinitis    Arthritis    generalized   Basal cell carcinoma (BCC) of skin of nose    Breast cancer ([place])    LEFT   Cancer ([facility])    LEFT breast   Cataract    RIGHT eye-not a surgical candidate at this time-   Effusion, right knee    Essential (primary) hypertension    on meds   Family history of breast cancer 08/21/2020   Family history of colon cancer 08/21/2020   Family history of pancreatic cancer 08/21/2020   Family history of uterine cancer 08/21/2020   Hyperlipidemia    on meds   Hypothyroidism    on meds   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   Personal history of breast cancer 08/21/2020   Varicose veins    Past Surgical History:  Procedure Laterality Date   BREAST LUMPECTOMY  1996   right breast   BREAST LUMPECTOMY  09/19/2020   COLONOSCOPY  2016   KN-MAC-pre p good-TICS-2 TA/SSP   COLONOSCOPY  10/17/2020   KN   ENDOVENOUS ABLATION SAPHENOUS VEIN W/ LASER Left 07-22-2013   left greater saphenous vein and sclerotherapy (left leg)  Sherren Mocha Early MD    ENDOVENOUS ABLATION SAPHENOUS VEIN W/ LASER Right 09-02-2013   right greater saphenous vein and sclerotherapy right leg  by Curt Jews MD   POLYPECTOMY  2016    TA/SSP   TUBAL LIGATION  1984   Social History   Socioeconomic History   Marital status: Married    Spouse name: Not on file   Number of children: 3   Years of education: 14   Highest education level: Some college, no degree  Occupational History   Occupation: retired  Tobacco Use   Smoking status: Never   Smokeless tobacco: Never  Vaping Use   Vaping Use: Never used  Substance and Sexual Activity   Alcohol use: Yes    Alcohol/week: 1.0 standard drink    Types: 1 Glasses of wine per week    Comment: 1 per month   Drug use: No   Sexual activity: Not on file  Other Topics Concern   Not on file  Social History Narrative   Lives at home with husband in a one story home.  Has 3 children.  Retired from [employer].  Education: some college.     Right handed   Caffeine: 2 cups/day, was more   Social Determinants of Health   Financial Resource Strain: Not  on file  Food Insecurity: Not on file  Transportation Needs: Not on file  Physical Activity: Not on file  Stress: Not on file  Social Connections: Not on file   Family History  Problem Relation Age of Onset   Other Mother        varicose veins   Colon cancer Mother 19   Dementia Mother    Colon polyps Mother 72   Heart disease Father    Hyperlipidemia Father    Heart attack Father    Cancer Father        unknown primary; ? pancreatic   Skin cancer Father    Breast cancer Sister        Ages 22, 65 (two primaries)   Colon polyps Sister 71   Endometrial cancer Maternal Aunt 35   Ovarian cancer Paternal Aunt 78   Ovarian cysts Paternal Aunt    Cancer - Ovarian Paternal Aunt    Breast cancer Cousin 46   Cervical cancer Daughter 75   Ovarian cancer Paternal Aunt 78   Pancreatic cancer Paternal Aunt 89   Breast cancer Cousin 63   Colon polyps Brother 34   Esophageal cancer Neg Hx    Stomach cancer Neg Hx    Rectal cancer Neg Hx    Allergies  Allergen Reactions   Amoxicillin      REACTION: rash   Current Outpatient Medications  Medication Sig Dispense Refill   acetaminophen (TYLENOL) 500 MG tablet Take 1 tablet (500 mg total) by mouth every 6 (six) hours as needed. 30 tablet 0   alendronate (FOSAMAX) 70 MG tablet Take 1 tablet by mouth once a week.     anastrozole (ARIMIDEX) 1 MG tablet Take 1 tablet (1 mg total) by mouth daily. 90 tablet 4   CALCIUM-VITAMIN D PO Take 1 tablet by mouth 2 (two) times daily.     cyanocobalamin (,VITAMIN B-12,) 1000 MCG/ML injection Inject 1,000 mcg into the muscle once a week.     donepezil (ARICEPT) 5 MG tablet Take 5 mg by mouth at bedtime.     ergocalciferol (VITAMIN D2) 1.25 MG (50000 UT) capsule Take 50,000 Units by mouth once a week.     levothyroxine (SYNTHROID) 75 MCG tablet synthroid  75 mcg tabs     losartan-hydrochlorothiazide (HYZAAR) 50-12.5 MG tablet Take 1 tablet by mouth daily.     pravastatin (PRAVACHOL) 80 MG tablet Take 80 mg by mouth daily.     Sod Citrate-Citric Acid (CITRIC ACID-SODIUM CITRATE PO) Take by mouth daily as needed.     No current facility-administered medications for this visit.   No results found.  Review of Systems:   A ROS was performed including pertinent positives and negatives as documented in the HPI.  Physical Exam :   Constitutional: NAD and appears stated age Neurological: Alert and oriented Psych: Appropriate affect and cooperative There were no vitals taken for this visit.   Comprehensive Musculoskeletal Exam:      Musculoskeletal Exam  Gait Normal  Alignment Normal   Right Left  Inspection Normal Normal  Palpation    Tenderness Lateral joint none  Crepitus positive None  Effusion none none  Range of Motion    Extension 0 0  Flexion 135 135  Strength    Extension 5/5 5/5  Flexion 5/5 5/5  Ligament Exam     Generalized Laxity No No  Lachman Negative Negative   Pivot Shift Negative Negative  Anterior Drawer Negative Negative  Valgus at 0  Negative Negative  Valgus  at 20 Negative Negative  Varus at 0 0 0  Varus at 20   0 0  Posterior Drawer at 90 0 0  Vascular/Lymphatic Exam    Edema None None  Venous Stasis Changes No No  Distal Circulation Normal Normal  Neurologic    Light Touch Sensation Intact Intact  Special Tests:    Left foot with a bunion deformity, bruising over dorsum of the foot  Imaging:   Xray (Right knee 4 views, left foot 3 view): Significant osteoarthritis with significant lateral joint space narrowing    I personally reviewed and interpreted the radiographs.   Assessment:   76 y.o. female with right knee pain consistent with osteoarthritis. At this time I have offered her an injection of the right knee although she would like to defer this until she is more symptomatic. With regard to her left foot, this is more consistent with a  contusion. I will plan to see back as needed for her right knee  Plan :    - Follow-up as need should she wish to pursue a right knee injection     I personally saw and evaluated the patient, and participated in the management and treatment plan.  Vanetta Mulders, MD Attending Physician, Orthopedic Surgery  This document was dictated using Dragon voice recognition software. A reasonable attempt at proof reading has been made to minimize errors.

## 2021-11-21 ENCOUNTER — Telehealth: Payer: Self-pay | Admitting: Orthopaedic Surgery

## 2021-11-21 NOTE — Telephone Encounter (Signed)
Patient called in asking if she has injection will that post pone how long it will be until she can get her knee replacement surgery? She is interested in gel injection. Would you advise the injection and if so can we schedule that? But if it is going to delay surgery then she does not want it. Please  advise if patient does not answer it is okay to leave answers on Voicemail.

## 2021-11-21 NOTE — Telephone Encounter (Signed)
Returned call to pt, she stated she was looking to get her TKA done early 2024 and is interested in scheduling a steroid injection as Dr. Sammuel Hines believes those are more beneficial than gel injections. I informed her the steroid injections should not be performed within 3 months of a procedure and she understood. Pt scheduled for a steroid injection  11/28/2021

## 2021-11-26 ENCOUNTER — Other Ambulatory Visit: Payer: Self-pay | Admitting: *Deleted

## 2021-11-26 DIAGNOSIS — C50412 Malignant neoplasm of upper-outer quadrant of left female breast: Secondary | ICD-10-CM

## 2021-11-26 DIAGNOSIS — Z170 Estrogen receptor positive status [ER+]: Secondary | ICD-10-CM

## 2021-11-26 MED ORDER — ANASTROZOLE 1 MG PO TABS: 1.0000 mg | ORAL_TABLET | Freq: Every day | ORAL | 4 refills | Status: DC

## 2021-11-28 ENCOUNTER — Ambulatory Visit (INDEPENDENT_AMBULATORY_CARE_PROVIDER_SITE_OTHER): Payer: Medicare Other | Admitting: Orthopaedic Surgery

## 2021-11-28 DIAGNOSIS — M25561 Pain in right knee: Secondary | ICD-10-CM

## 2021-11-28 DIAGNOSIS — M1711 Unilateral primary osteoarthritis, right knee: Secondary | ICD-10-CM

## 2021-11-28 MED ORDER — TRIAMCINOLONE ACETONIDE 40 MG/ML IJ SUSP
80.0000 mg | INTRAMUSCULAR | Status: AC | PRN
  Administered 2021-11-28: 80 mg via INTRA_ARTICULAR

## 2021-11-28 MED ORDER — LIDOCAINE HCL 1 % IJ SOLN
4.0000 mL | INTRAMUSCULAR | Status: AC | PRN
  Administered 2021-11-28: 4 mL

## 2021-11-28 NOTE — Progress Notes (Signed)
Chief Complaint: left foot pain, right knee pain     History of Present Illness:   11/28/2021: Presents today for a right knee intra-articular injection.  She has noticed since she was given the diagnosis that the knee has been more sore.  She is here today seeking an intra-articular injection.  Sandra Galloway is a 76 y.o. female presents today for follow-up of her left foot. She states that she rolled her foot under her one week prior and subsequently has had bruising. She was given a boot by the ED and her pain has resolved over the course of the last week. With regard to her right knee she has had pain for the last several year which she rates as a 1.5/10 She is experience crepitus with longer walks. She is here today for further assessment. She has not had any injections or physical therpay.    Surgical History:   none  PMH/PSH/Family History/Social History/Meds/Allergies:    Past Medical History:  Diagnosis Date  . Allergic rhinitis   . Arthritis    generalized  . Basal cell carcinoma (BCC) of skin of nose   . Breast cancer ([place])    LEFT  . Cancer ([place])    LEFT breast  . Cataract    RIGHT eye-not a surgical candidate at this time-  . Effusion, right knee   . Essential (primary) hypertension    on meds  . Family history of breast cancer 08/21/2020  . Family history of colon cancer 08/21/2020  . Family history of pancreatic cancer 08/21/2020  . Family history of uterine cancer 08/21/2020  . Hyperlipidemia    on meds  . Hypothyroidism    on meds  . 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 Personal history of breast cancer 08/21/2020  . Varicose veins    Past Surgical History:  Procedure Laterality Date  . BREAST LUMPECTOMY  1996   right breast  . BREAST LUMPECTOMY  09/19/2020  . COLONOSCOPY  2016   KN-MAC-pre p good-TICS-2 TA/SSP  . COLONOSCOPY  10/17/2020   KN  . ENDOVENOUS ABLATION SAPHENOUS VEIN W/ LASER Left 07-22-2013   left  greater saphenous vein and sclerotherapy (left leg)  Sherren Mocha Early MD   . ENDOVENOUS ABLATION SAPHENOUS VEIN W/ LASER Right 09-02-2013   right greater saphenous vein and sclerotherapy right leg  by Curt Jews MD  . POLYPECTOMY  2016   TA/SSP  . TUBAL LIGATION  1984   Social History   Socioeconomic History  . Marital status: Married    Spouse name: Not on file  . Number of children: 3  . Years of education: 62  . Highest education level: Some college, no degree  Occupational History  . Occupation: retired  Tobacco Use  . Smoking status: Never  . Smokeless tobacco: Never  Vaping Use  . Vaping Use: Never used  Substance and Sexual Activity  . Alcohol use: Yes    Alcohol/week: 1.0 standard drink of alcohol    Types: 1 Glasses of wine per week    Comment: 1 per month  . Drug use: No  . Sexual activity: Not on file  Other Topics Concern  . Not on file  Social History Narrative   Lives at home with husband in a one story home.  Has 3 children.  Retired from [employer].  Education: some college.     Right handed   Caffeine: 2 cups/day, was more   Social Determinants of Health   Financial Resource Strain: Not on file  Food Insecurity: No Food Insecurity (09/13/2020)   Hunger Vital Sign   . Worried About Charity fundraiser in the Last Year: Never true   . Ran Out of Food in the Last Year: Never true  Transportation Needs: No Transportation Needs (09/13/2020)   PRAPARE - Transportation   . Lack of Transportation (Medical): No   . Lack of Transportation (Non-Medical): No  Physical Activity: Not on file  Stress: Not on file  Social Connections: Not on file   Family History  Problem Relation Age of Onset  . Other Mother        varicose veins  . Colon cancer Mother 36  . Dementia Mother   . Colon polyps Mother 58  . Heart disease Father   . Hyperlipidemia Father   . Heart attack Father   . Cancer Father        unknown primary; ? pancreatic   . Skin cancer Father   . Breast cancer Sister        Ages 82, 37 (two primaries)  . Colon polyps Sister 38  . Endometrial cancer Maternal Aunt 35  . Ovarian cancer Paternal Aunt 78  . Ovarian cysts Paternal Aunt   . Cancer - Ovarian Paternal Aunt   . Breast cancer Cousin 22  . Cervical cancer Daughter 58  . Ovarian cancer Paternal Aunt 78  . Pancreatic cancer Paternal Aunt 89  . Breast cancer Cousin 20  . Colon polyps Brother 48  . Esophageal cancer Neg Hx   . Stomach cancer Neg Hx   . Rectal cancer Neg Hx    Allergies  Allergen Reactions  . Amoxicillin     REACTION: rash   Current Outpatient Medications  Medication Sig Dispense Refill  . acetaminophen (TYLENOL) 500 MG tablet Take 1 tablet (500 mg total) by mouth every 6 (six) hours as needed. 30 tablet 0  . alendronate (FOSAMAX) 70 MG tablet Take 1 tablet by mouth once a week.    Marland Kitchen anastrozole (ARIMIDEX) 1 MG tablet Take 1 tablet (1 mg total) by mouth daily. 90 tablet 4  . CALCIUM-VITAMIN D PO Take 1 tablet by mouth 2 (two) times daily.    . cyanocobalamin (,VITAMIN B-12,) 1000 MCG/ML injection Inject 1,000 mcg into the muscle once a week.    . donepezil (ARICEPT) 5 MG tablet Take 5 mg by mouth at bedtime.    . ergocalciferol (VITAMIN D2) 1.25 MG (50000 UT) capsule Take 50,000 Units by mouth once a week.    . levothyroxine (SYNTHROID) 75 MCG tablet synthroid  75 mcg tabs    . losartan-hydrochlorothiazide (HYZAAR) 50-12.5 MG tablet Take 1 tablet by mouth daily.    . pravastatin (PRAVACHOL) 80 MG tablet Take 80 mg by mouth daily.    . Sod Citrate-Citric Acid (CITRIC ACID-SODIUM CITRATE PO) Take by mouth daily as needed.     No current facility-administered medications for this visit.   No results found.  Review of Systems:   A ROS was performed including pertinent positives and negatives as documented in the HPI.  Physical Exam :   Constitutional: NAD and appears stated age Neurological: Alert and oriented Psych:  Appropriate affect and cooperative There were no vitals taken for this visit.   Comprehensive Musculoskeletal Exam:  Musculoskeletal Exam  Gait Normal  Alignment Normal   Right Left  Inspection Normal Normal  Palpation    Tenderness Lateral joint none  Crepitus positive None  Effusion none none  Range of Motion    Extension 0 0  Flexion 135 135  Strength    Extension 5/5 5/5  Flexion 5/5 5/5  Ligament Exam     Generalized Laxity No No  Lachman Negative Negative   Pivot Shift Negative Negative  Anterior Drawer Negative Negative  Valgus at 0 Negative Negative  Valgus at 20 Negative Negative  Varus at 0 0 0  Varus at 20   0 0  Posterior Drawer at 90 0 0  Vascular/Lymphatic Exam    Edema None None  Venous Stasis Changes No No  Distal Circulation Normal Normal  Neurologic    Light Touch Sensation Intact Intact  Special Tests:    Left foot with a bunion deformity, bruising over dorsum of the foot  Imaging:   Xray (Right knee 4 views, left foot 3 view): Significant osteoarthritis with significant lateral joint space narrowing    I personally reviewed and interpreted the radiographs.   Assessment:   76 y.o. female with right knee pain consistent with osteoarthritis.  Right knee has become more symptomatic at this time.  At this time we will plan for right knee intra-articular ultrasound-guided injection  Plan :    -Plan for right knee intra-articular ultrasound-guided injection after verbal consent obtained    Procedure Note  Patient: Sandra Galloway             Date of Birth: 1945-11-18           MRN: [id number]             Visit Date: 11/28/2021  Procedures: Visit Diagnoses: No diagnosis found.  Large Joint Inj on 11/28/2021 2:46 PM Indications: pain Details: 22 G 1.5 in needle, ultrasound-guided anterior approach  Arthrogram: No  Medications: 4 mL lidocaine 1 %; 80 mg triamcinolone acetonide 40 MG/ML Outcome: tolerated well, no immediate  complications Procedure, treatment alternatives, risks and benefits explained, specific risks discussed. Consent was given by the patient. Immediately prior to procedure a time out was called to verify the correct patient, procedure, equipment, support staff and site/side marked as required. Patient was prepped and draped in the usual sterile fashion.         I personally saw and evaluated the patient, and participated in the management and treatment plan.  Vanetta Mulders, MD Attending Physician, Orthopedic Surgery  This document was dictated using Dragon voice recognition software. A reasonable attempt at proof reading has been made to minimize errors.

## 2021-12-11 ENCOUNTER — Ambulatory Visit (HOSPITAL_COMMUNITY)
Admission: RE | Admit: 2021-12-11 | Discharge: 2021-12-11 | Disposition: A | Discharge status: Home / Self Care | Payer: Medicare Other | Source: Ambulatory Visit | Attending: Oncology | Admitting: Oncology

## 2021-12-11 DIAGNOSIS — C50412 Malignant neoplasm of upper-outer quadrant of left female breast: Secondary | ICD-10-CM | POA: Insufficient documentation

## 2021-12-11 DIAGNOSIS — Z853 Personal history of malignant neoplasm of breast: Secondary | ICD-10-CM | POA: Insufficient documentation

## 2021-12-11 DIAGNOSIS — Z9189 Other specified personal risk factors, not elsewhere classified: Secondary | ICD-10-CM | POA: Insufficient documentation

## 2021-12-11 DIAGNOSIS — Z170 Estrogen receptor positive status [ER+]: Secondary | ICD-10-CM | POA: Insufficient documentation

## 2021-12-11 DIAGNOSIS — Z1289 Encounter for screening for malignant neoplasm of other sites: Secondary | ICD-10-CM | POA: Insufficient documentation

## 2021-12-13 NOTE — Progress Notes (Signed)
Patient Care Team: Hayden Rasmussen, MD as PCP - General (Family Medicine) Mauro Kaufmann, RN as Oncology Nurse Navigator Carlynn Spry, Charlott Holler, RN as Oncology Nurse Navigator Erroll Luna, MD as Consulting Physician (General Surgery) Magrinat, Virgie Dad, MD (Inactive) as Consulting Physician (Oncology) Eppie Gibson, MD as Attending Physician (Radiation Oncology) Melvenia Beam, MD as Consulting Physician (Neurology) Keene Breath., MD (Ophthalmology) Jacelyn Pi, MD as Referring Physician (Endocrinology) Ulla Gallo, MD as Consulting Physician (Dermatology)  DIAGNOSIS:  Encounter Diagnoses  Name Primary?   Malignant neoplasm of upper-outer quadrant of left breast in female, estrogen receptor positive ([place])    Monoallelic mutation of BAP1 gene Yes    SUMMARY OF ONCOLOGIC HISTORY: Oncology History  Malignant neoplasm of upper-outer quadrant of left breast in female, estrogen receptor positive ([place])  09/11/2020 Initial Diagnosis   Malignant neoplasm of upper-outer quadrant of left breast in female, estrogen receptor positive ([place])   09/13/2020 Cancer Staging   Staging form: Breast, AJCC 8th Edition - Clinical: Stage IA (cT1a, cN0, cM0, G1, ER+, PR-, HER2-) - Signed by Chauncey Cruel, MD on 09/13/2020 Stage prefix: Initial diagnosis Histologic grading system: 3 grade system     CHIEF COMPLIANT:  Estrogen receptor positive breast cancer; BAP1 mutation on anastrozole. Establish oncology care with the Dr. Lindi Adie  INTERVAL HISTORY: Sandra Galloway is a 76 y.o with above mention. She presents to the clinic today for a follow-up and establish care with Dr. Lindi Adie. States that she is tolerating the anastrozole very well. Denies any side effects or symptoms. She had some concerns about genetics testing. She was the only one out of her siblings that had a gene. She wanted to know if she needed a chest x ray. She is getting time for exercise she is walking at least 3 times a  week.   ALLERGIES:  is allergic to amoxicillin.  MEDICATIONS:  Current Outpatient Medications  Medication Sig Dispense Refill   anastrozole (ARIMIDEX) 1 MG tablet Take 1 tablet (1 mg total) by mouth daily. 90 tablet 4   CALCIUM-VITAMIN D PO Take 1 tablet by mouth 2 (two) times daily.     cyanocobalamin (,VITAMIN B-12,) 1000 MCG/ML injection Inject 1,000 mcg into the muscle once. Once a month     donepezil (ARICEPT) 10 MG tablet Take 10 mg by mouth at bedtime.     levothyroxine (SYNTHROID) 75 MCG tablet synthroid  75 mcg tabs     LOSARTAN POTASSIUM-HCTZ PO Take 1.5 tablets by mouth daily.     pravastatin (PRAVACHOL) 80 MG tablet Take 80 mg by mouth daily.     Sod Citrate-Citric Acid (CITRIC ACID-SODIUM CITRATE PO) Take by mouth daily as needed.     No current facility-administered medications for this visit.    PHYSICAL EXAMINATION: ECOG PERFORMANCE STATUS: 1 - Symptomatic but completely ambulatory  Vitals:   12/27/21 0845  BP: 125/82  Pulse: 80  Resp: 18  Temp: 97.7 F (36.5 C)  SpO2: 98%   Filed Weights   12/27/21 0845  Weight: 143 lb 9.6 oz (65.1 kg)    BREAST: No palpable masses or nodules in either right or left breasts. No palpable axillary supraclavicular or infraclavicular adenopathy no breast tenderness or nipple discharge. (exam performed in the presence of a chaperone)  LABORATORY DATA:  I have reviewed the data as listed    Latest Ref Rng & Units 12/25/2021    8:39 AM 12/26/2020    8:23 AM 09/13/2020    8:33  AM  CMP  Glucose 70 - 99 mg/dL 87  85  89   BUN 8 - 23 mg/dL _0 Creatinine 0.44 - 1.00 mg/dL 0.80  0.82  0.79   Sodium 135 - 145 mmol/L 135  140  140   Potassium 3.5 - 5.1 mmol/L 4.2  3.9  4.1   Chloride 98 - 111 mmol/L 102  103  105   CO2 22 - 32 mmol/L _1 Calcium 8.9 - 10.3 mg/dL 9.2  9.7  8.7   Total Protein 6.5 - 8.1 g/dL 6.8  7.3  7.1   Total Bilirubin 0.3 - 1.2 mg/dL 0.4  0.4  0.3   Alkaline Phos 38 - 126 U/L 55  69  79    AST 15 - 41 U/L _2 ALT 0 - 44 U/L _3 Lab Results  Component Value Date   WBC 5.6 12/25/2021   HGB 12.0 12/25/2021   HCT 35.4 (L) 12/25/2021   MCV 91.0 12/25/2021   PLT 313 12/25/2021   NEUTROABS 2.9 12/25/2021    ASSESSMENT & PLAN:  Malignant neoplasm of upper-outer quadrant of left breast in female, estrogen receptor positive ([place]) (1) status post left lumpectomy and axillary lymph node dissection in 1996 for a TX N1 invasive breast cancer, status post adjuvant chemotherapy  [most likely cyclophosphamide and doxorubicin every 3 weeks x 6], followed by adjuvant radiation, then tamoxifen for 5 years   (2) genetics testing 09/11/2020 through the [place] +RNAinsight Panel 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)   (3) left breast upper outer quadrant biopsy 09/04/2020 shows 3 foci of invasive ductal carcinoma, grade 1,  largest 0.15 cm, estrogen receptor strongly positive, progesterone receptor and HER2 negative, with an MIB-1 of 2% = pT1a cN0, stage IA   (4)  lumpectomy with no sentinel lymph node sampling 09/19/2020 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 Breast Exam: 12/27/21: Benign 2. Mammogram February 2022: Benign  Left breast discomfort: We will obtain a mammogram in August which will be 6 months from the previous mammogram. BAP1 mutation: She will need an annual renal ultrasound to evaluate for renal cell cancer. She follows with her ophthalmologist to rule out uveal melanoma.  RTC in 1 year    Orders Placed This Encounter  Procedures   MM DIAG BREAST TOMO UNI LEFT    Standing Status:   Future     Standing Expiration Date:   12/28/2022    Order Specific Question:   Reason for Exam (SYMPTOM  OR DIAGNOSIS REQUIRED)    Answer:   Left breast cancer history with pain    Order Specific Question:   Preferred imaging location?    Answer:   [facility]    Order Specific Question:   Release to patient    Answer:   Immediate   US RENAL    Standing Status:   Future    Standing Expiration Date:   12/28/2022    Order Specific Question:   Reason for Exam (SYMPTOM  OR DIAGNOSIS REQUIRED)    Answer:   Genetic mutation which can cause kidney cancer BAP-1    Order Specific Question:   Preferred imaging location?  Answer:   [facility]    Order Specific Question:   Release to patient    Answer:   Immediate   The patient has a good understanding of the overall plan. she agrees with it. she will call with any problems that may develop before the next visit here. Total time spent: 30 mins including face to face time and time spent for planning, charting and co-ordination of care   Harriette Ohara, MD 12/27/21    I Gardiner Coins am scribing for Dr. Lindi Adie  I have reviewed the above documentation for accuracy and completeness, and I agree with the above.

## 2021-12-25 ENCOUNTER — Other Ambulatory Visit (HOSPITAL_COMMUNITY): Payer: Self-pay | Admitting: Family Medicine

## 2021-12-25 ENCOUNTER — Other Ambulatory Visit: Payer: Self-pay

## 2021-12-25 ENCOUNTER — Inpatient Hospital Stay: Payer: Medicare Other | Attending: Hematology and Oncology

## 2021-12-25 DIAGNOSIS — Z79811 Long term (current) use of aromatase inhibitors: Secondary | ICD-10-CM | POA: Diagnosis not present

## 2021-12-25 DIAGNOSIS — Z853 Personal history of malignant neoplasm of breast: Secondary | ICD-10-CM | POA: Diagnosis present

## 2021-12-25 DIAGNOSIS — R4189 Other symptoms and signs involving cognitive functions and awareness: Secondary | ICD-10-CM

## 2021-12-25 DIAGNOSIS — Z170 Estrogen receptor positive status [ER+]: Secondary | ICD-10-CM

## 2021-12-25 DIAGNOSIS — C50412 Malignant neoplasm of upper-outer quadrant of left female breast: Secondary | ICD-10-CM

## 2021-12-25 LAB — CBC WITH DIFFERENTIAL (CANCER CENTER ONLY)
Abs Immature Granulocytes: 0.02 10*3/uL (ref 0.00–0.07)
Basophils Absolute: 0 10*3/uL (ref 0.0–0.1)
Basophils Relative: 1 %
Eosinophils Absolute: 0.2 10*3/uL (ref 0.0–0.5)
Eosinophils Relative: 4 %
HCT: 35.4 % — ABNORMAL LOW (ref 36.0–46.0)
Hemoglobin: 12 g/dL (ref 12.0–15.0)
Immature Granulocytes: 0 %
Lymphocytes Relative: 31 %
Lymphs Abs: 1.7 10*3/uL (ref 0.7–4.0)
MCH: 30.8 pg (ref 26.0–34.0)
MCHC: 33.9 g/dL (ref 30.0–36.0)
MCV: 91 fL (ref 80.0–100.0)
Monocytes Absolute: 0.7 10*3/uL (ref 0.1–1.0)
Monocytes Relative: 13 %
Neutro Abs: 2.9 10*3/uL (ref 1.7–7.7)
Neutrophils Relative %: 51 %
Platelet Count: 313 10*3/uL (ref 150–400)
RBC: 3.89 MIL/uL (ref 3.87–5.11)
RDW: 13.9 % (ref 11.5–15.5)
WBC Count: 5.6 10*3/uL (ref 4.0–10.5)
nRBC: 0 % (ref 0.0–0.2)

## 2021-12-25 LAB — CMP (CANCER CENTER ONLY)
ALT: 14 U/L (ref 0–44)
AST: 17 U/L (ref 15–41)
Albumin: 4 g/dL (ref 3.5–5.0)
Alkaline Phosphatase: 55 U/L (ref 38–126)
Anion gap: 4 — ABNORMAL LOW (ref 5–15)
BUN: 12 mg/dL (ref 8–23)
CO2: 29 mmol/L (ref 22–32)
Calcium: 9.2 mg/dL (ref 8.9–10.3)
Chloride: 102 mmol/L (ref 98–111)
Creatinine: 0.8 mg/dL (ref 0.44–1.00)
GFR, Estimated: >60 mL/min (ref >60)
Glucose, Bld: 87 mg/dL (ref 70–99)
Potassium: 4.2 mmol/L (ref 3.5–5.1)
Sodium: 135 mmol/L (ref 135–145)
Total Bilirubin: 0.4 mg/dL (ref 0.3–1.2)
Total Protein: 6.8 g/dL (ref 6.5–8.1)

## 2021-12-26 ENCOUNTER — Telehealth: Payer: Self-pay | Admitting: Hematology and Oncology

## 2021-12-26 NOTE — Telephone Encounter (Signed)
Rationale for obtaining renal ultrasound: Follow-up of bilateral renal cysts to ensure stability of the cysts.

## 2021-12-26 NOTE — Assessment & Plan Note (Addendum)
(  1) status post left lumpectomy and axillary lymph node dissection in 1996 for a TX N1 invasive breast cancer, status post adjuvant chemotherapy  [most likely cyclophosphamide and doxorubicin every 3 weeks x 6], followed by adjuvant radiation, then tamoxifen for 5 years  (2) genetics testing 09/11/2020 through the [place] +RNAinsight Panel 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)   (3) left breast upper outer quadrant biopsy 09/04/2020 shows 3 foci of invasive ductal carcinoma, grade 1,  largest 0.15 cm, estrogen receptor strongly positive, progesterone receptor and HER2 negative, with an MIB-1 of 2% = pT1a cN0, stage IA  (4)  lumpectomy with no sentinel lymph node sampling 09/19/2020 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 Breast Exam: 12/27/21: Benign 2. Mammogram  RTC in 1 year

## 2021-12-27 ENCOUNTER — Other Ambulatory Visit: Payer: Self-pay

## 2021-12-27 ENCOUNTER — Inpatient Hospital Stay (HOSPITAL_BASED_OUTPATIENT_CLINIC_OR_DEPARTMENT_OTHER): Payer: Medicare Other | Admitting: Hematology and Oncology

## 2021-12-27 VITALS — BP 125/82 | HR 80 | Temp 97.7°F | Resp 18 | Ht 64.0 in | Wt 143.6 lb

## 2021-12-27 DIAGNOSIS — C50412 Malignant neoplasm of upper-outer quadrant of left female breast: Secondary | ICD-10-CM

## 2021-12-27 DIAGNOSIS — Z170 Estrogen receptor positive status [ER+]: Secondary | ICD-10-CM

## 2021-12-27 DIAGNOSIS — Z1509 Genetic susceptibility to other malignant neoplasm: Secondary | ICD-10-CM

## 2021-12-27 DIAGNOSIS — Z853 Personal history of malignant neoplasm of breast: Secondary | ICD-10-CM | POA: Diagnosis not present

## 2021-12-28 ENCOUNTER — Other Ambulatory Visit: Payer: Self-pay | Admitting: Hematology and Oncology

## 2021-12-28 ENCOUNTER — Telehealth: Payer: Self-pay | Admitting: Hematology and Oncology

## 2021-12-28 DIAGNOSIS — C50412 Malignant neoplasm of upper-outer quadrant of left female breast: Secondary | ICD-10-CM

## 2021-12-28 NOTE — Telephone Encounter (Signed)
Scheduled appointment per 7/13 los. Left voicemail.

## 2022-01-01 ENCOUNTER — Ambulatory Visit (HOSPITAL_COMMUNITY)
Admission: RE | Admit: 2022-01-01 | Discharge: 2022-01-01 | Disposition: A | Discharge status: Home / Self Care | Payer: Medicare Other | Source: Ambulatory Visit | Attending: Cardiology | Admitting: Cardiology

## 2022-01-01 DIAGNOSIS — R4189 Other symptoms and signs involving cognitive functions and awareness: Secondary | ICD-10-CM

## 2022-01-15 ENCOUNTER — Ambulatory Visit
Admission: RE | Admit: 2022-01-15 | Discharge: 2022-01-15 | Disposition: A | Discharge status: Home / Self Care | Payer: Medicare Other | Source: Ambulatory Visit | Attending: Hematology and Oncology | Admitting: Hematology and Oncology

## 2022-01-15 ENCOUNTER — Ambulatory Visit: Payer: Medicare Other

## 2022-01-15 ENCOUNTER — Other Ambulatory Visit: Payer: Self-pay | Admitting: Hematology and Oncology

## 2022-01-15 DIAGNOSIS — C50412 Malignant neoplasm of upper-outer quadrant of left female breast: Secondary | ICD-10-CM

## 2022-01-23 ENCOUNTER — Ambulatory Visit: Payer: Medicare Other

## 2022-01-23 ENCOUNTER — Ambulatory Visit
Admission: RE | Admit: 2022-01-23 | Discharge: 2022-01-23 | Disposition: A | Discharge status: Home / Self Care | Payer: Medicare Other | Source: Ambulatory Visit | Attending: Hematology and Oncology | Admitting: Hematology and Oncology

## 2022-01-23 DIAGNOSIS — C50412 Malignant neoplasm of upper-outer quadrant of left female breast: Secondary | ICD-10-CM

## 2022-05-19 ENCOUNTER — Emergency Department (HOSPITAL_COMMUNITY)
Admission: EM | Admit: 2022-05-19 | Discharge: 2022-05-19 | Disposition: A | Discharge status: Home / Self Care | Payer: Medicare Other | Attending: Emergency Medicine | Admitting: Emergency Medicine

## 2022-05-19 ENCOUNTER — Other Ambulatory Visit: Payer: Self-pay

## 2022-05-19 ENCOUNTER — Emergency Department (HOSPITAL_COMMUNITY): Payer: Medicare Other

## 2022-05-19 DIAGNOSIS — Z85828 Personal history of other malignant neoplasm of skin: Secondary | ICD-10-CM | POA: Insufficient documentation

## 2022-05-19 DIAGNOSIS — E039 Hypothyroidism, unspecified: Secondary | ICD-10-CM | POA: Insufficient documentation

## 2022-05-19 DIAGNOSIS — S0990XA Unspecified injury of head, initial encounter: Secondary | ICD-10-CM | POA: Diagnosis not present

## 2022-05-19 DIAGNOSIS — S199XXA Unspecified injury of neck, initial encounter: Secondary | ICD-10-CM | POA: Diagnosis present

## 2022-05-19 DIAGNOSIS — Y92410 Unspecified street and highway as the place of occurrence of the external cause: Secondary | ICD-10-CM | POA: Diagnosis not present

## 2022-05-19 DIAGNOSIS — Z853 Personal history of malignant neoplasm of breast: Secondary | ICD-10-CM | POA: Insufficient documentation

## 2022-05-19 DIAGNOSIS — I10 Essential (primary) hypertension: Secondary | ICD-10-CM | POA: Insufficient documentation

## 2022-05-19 DIAGNOSIS — S161XXA Strain of muscle, fascia and tendon at neck level, initial encounter: Secondary | ICD-10-CM | POA: Diagnosis not present

## 2022-06-25 ENCOUNTER — Telehealth: Payer: Self-pay | Admitting: Genetic Counselor

## 2022-06-25 NOTE — Telephone Encounter (Signed)
Patient was previously scheduled for genetics appt 1/15.  Called patient to discuss that updated genetic testing is not needed at this time.  She does not have any questions that she feels needs to be addressed by genetics at this time.  Daughter recently diagnosed with breast cancer and GT is pending. She knows to reach out if she has questions in the future.

## 2022-07-01 ENCOUNTER — Inpatient Hospital Stay: Payer: Medicare Other | Admitting: Licensed Clinical Social Worker

## 2022-07-01 ENCOUNTER — Inpatient Hospital Stay: Payer: Medicare Other

## 2022-08-27 ENCOUNTER — Telehealth: Payer: Self-pay | Admitting: Genetic Counselor

## 2022-08-27 NOTE — Telephone Encounter (Signed)
Patient already had pre and post-test genetic counseling.  Called to see if she would like to keep her genetics appt scheduled 3/14.  LVM requesting call back.

## 2022-08-28 ENCOUNTER — Other Ambulatory Visit: Payer: Self-pay | Admitting: *Deleted

## 2022-08-28 DIAGNOSIS — C50412 Malignant neoplasm of upper-outer quadrant of left female breast: Secondary | ICD-10-CM

## 2022-08-28 NOTE — Progress Notes (Signed)
Verbal orders received from MD to obtain Diagnostic mammogram based on last mammogram results in August.  Orders placed.

## 2022-08-28 NOTE — Telephone Encounter (Signed)
Discussed no updated genetic testing is needed.  Cancelled genetic counseling appt.  Does not need to be rescheduled.  She knows to reach out with questions or concerns.

## 2022-08-29 ENCOUNTER — Inpatient Hospital Stay: Payer: Medicare Other | Admitting: Genetic Counselor

## 2022-08-29 ENCOUNTER — Inpatient Hospital Stay: Payer: Medicare Other

## 2022-09-27 ENCOUNTER — Other Ambulatory Visit: Payer: Self-pay | Admitting: Endocrinology

## 2022-09-27 DIAGNOSIS — E010 Iodine-deficiency related diffuse (endemic) goiter: Secondary | ICD-10-CM

## 2022-10-22 ENCOUNTER — Ambulatory Visit
Admission: RE | Admit: 2022-10-22 | Discharge: 2022-10-22 | Disposition: A | Discharge status: Home / Self Care | Payer: Medicare Other | Source: Ambulatory Visit | Attending: Endocrinology | Admitting: Endocrinology

## 2022-10-22 DIAGNOSIS — E010 Iodine-deficiency related diffuse (endemic) goiter: Secondary | ICD-10-CM

## 2022-10-24 ENCOUNTER — Ambulatory Visit (INDEPENDENT_AMBULATORY_CARE_PROVIDER_SITE_OTHER): Payer: Medicare Other | Admitting: Orthopaedic Surgery

## 2022-10-24 DIAGNOSIS — M1711 Unilateral primary osteoarthritis, right knee: Secondary | ICD-10-CM | POA: Diagnosis not present

## 2022-10-24 NOTE — Progress Notes (Signed)
Chief Complaint: left foot pain, right knee pain     History of Present Illness:   10/24/2022: Presents today for further discussion of her right knee.  Overall this is intermittently painful although very mild.  She is tolerating it quite well.  She is continuing to take care of of her husband with Parkinson's disease who is progression has slowed quite significantly and is now doing quite well at [facility] burn.  Manjot Peiffer is a 77 y.o. female presents today for follow-up of her left foot. She states that she rolled her foot under her one week prior and subsequently has had bruising. She was given a boot by the ED and her pain has resolved over the course of the last week. With regard to her right knee she has had pain for the last several year which she rates as a 1.5/10 She is experience crepitus with longer walks. She is here today for further assessment. She has not had any injections or physical therpay.    Surgical History:   none  PMH/PSH/Family History/Social History/Meds/Allergies:    Past Medical History:  Diagnosis Date   Allergic rhinitis    Arthritis    generalized   Basal cell carcinoma (BCC) of skin of nose    Breast cancer (HCC)    LEFT   Cancer (HCC)    LEFT breast   Cataract    RIGHT eye-not a surgical candidate at this time-   Effusion, right knee    Essential (primary) hypertension    on meds   Family history of breast cancer 08/21/2020   Family history of colon cancer 08/21/2020   Family history of pancreatic cancer 08/21/2020   Family history of uterine cancer 08/21/2020   Hyperlipidemia    on meds   Hypothyroidism    on meds   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   Personal history of breast cancer 08/21/2020   Varicose veins    Past Surgical History:  Procedure Laterality Date   BREAST LUMPECTOMY  1996   right breast   BREAST LUMPECTOMY  09/19/2020   COLONOSCOPY  2016   KN-MAC-pre p good-TICS-2 TA/SSP    COLONOSCOPY  10/17/2020   KN   ENDOVENOUS ABLATION SAPHENOUS VEIN W/ LASER Left 07-22-2013   left greater saphenous vein and sclerotherapy (left leg)  Tawanna Cooler Early MD    ENDOVENOUS ABLATION SAPHENOUS VEIN W/ LASER Right 09-02-2013   right greater saphenous vein and sclerotherapy right leg  by Gretta Began MD   POLYPECTOMY  2016   TA/SSP   TUBAL LIGATION  1984   Social History   Socioeconomic History   Marital status: Married    Spouse name: Not on file   Number of children: 3   Years of education: 14   Highest education level: Some college, no degree  Occupational History   Occupation: retired  Tobacco Use   Smoking status: Never   Smokeless tobacco: Never  Vaping Use   Vaping Use: Never used  Substance and Sexual Activity   Alcohol use: Yes    Alcohol/week: 1.0 standard drink of alcohol    Types: 1 Glasses of wine per week    Comment: 1 per month   Drug use: No   Sexual activity: Not on file  Other Topics Concern   Not on file  Social History Narrative   Lives at home with husband in a one story home.  Has 3 children.  Retired from [employer].  Education: some college.     Right handed   Caffeine: 2 cups/day, was more   Social Determinants of Health   Financial Resource Strain: Not on file  Food Insecurity: No Food Insecurity (09/13/2020)   Hunger Vital Sign    Worried About Running Out of Food in the Last Year: Never true    Ran Out of Food in the Last Year: Never true  Transportation Needs: No Transportation Needs (09/13/2020)   PRAPARE - Administrator, Civil Service (Medical): No    Lack of Transportation (Non-Medical): No  Physical Activity: Not on file  Stress: Not on file  Social Connections: Not on file   Family History  Problem Relation Age of Onset   Other Mother        varicose veins   Colon cancer Mother 41   Dementia Mother    Colon polyps Mother 54   Heart disease Father    Hyperlipidemia Father     Heart attack Father    Cancer Father        unknown primary; ? pancreatic   Skin cancer Father    Breast cancer Sister        Ages 51, 37 (two primaries)   Colon polyps Sister 73   Endometrial cancer Maternal Aunt 35   Ovarian cancer Paternal Aunt 15   Ovarian cysts Paternal Aunt    Cancer - Ovarian Paternal Aunt    Breast cancer Cousin 46   Cervical cancer Daughter 64   Ovarian cancer Paternal Aunt 13   Pancreatic cancer Paternal Aunt 32   Breast cancer Cousin 63   Colon polyps Brother 62   Esophageal cancer Neg Hx    Stomach cancer Neg Hx    Rectal cancer Neg Hx    Allergies  Allergen Reactions   Amoxicillin     REACTION: rash   Amoxicillin Hives   Current Outpatient Medications  Medication Sig Dispense Refill   anastrozole (ARIMIDEX) 1 MG tablet Take 1 tablet (1 mg total) by mouth daily. 90 tablet 4   CALCIUM-VITAMIN D PO Take 1 tablet by mouth 2 (two) times daily.     cyanocobalamin (,VITAMIN B-12,) 1000 MCG/ML injection Inject 1,000 mcg into the muscle once. Once a month     donepezil (ARICEPT) 10 MG tablet Take 10 mg by mouth at bedtime.     levothyroxine (SYNTHROID) 75 MCG tablet synthroid  75 mcg tabs     LOSARTAN POTASSIUM-HCTZ PO Take 1.5 tablets by mouth daily.     pravastatin (PRAVACHOL) 80 MG tablet Take 80 mg by mouth daily.     Sod Citrate-Citric Acid (CITRIC ACID-SODIUM CITRATE PO) Take by mouth daily as needed.     No current facility-administered medications for this visit.   No results found.  Review of Systems:   A ROS was performed including pertinent positives and negatives as documented in the HPI.  Physical Exam :   Constitutional: NAD and appears stated age Neurological: Alert and oriented Psych: Appropriate affect and cooperative There were no vitals taken for this visit.   Comprehensive Musculoskeletal Exam:      Musculoskeletal Exam  Gait Normal  Alignment Normal   Right Left  Inspection Normal Normal  Palpation    Tenderness  Lateral joint none  Crepitus positive None  Effusion none none  Range of Motion    Extension 0 0  Flexion 135 135  Strength    Extension 5/5 5/5  Flexion 5/5 5/5  Ligament Exam     Generalized Laxity No No  Lachman Negative Negative   Pivot Shift Negative Negative  Anterior Drawer Negative Negative  Valgus at 0 Negative Negative  Valgus at 20 Negative Negative  Varus at 0 0 0  Varus at 20   0 0  Posterior Drawer at 90 0 0  Vascular/Lymphatic Exam    Edema None None  Venous Stasis Changes No No  Distal Circulation Normal Normal  Neurologic    Light Touch Sensation Intact Intact  Special Tests:    Left foot with a bunion deformity, bruising over dorsum of the foot  Imaging:   Xray (Right knee 4 views, left foot 3 view): Significant osteoarthritis with significant lateral joint space narrowing    I personally reviewed and interpreted the radiographs.   Assessment:   77 y.o. female with right knee pain consistent with osteoarthritis.  At this time she is tolerating her right knee osteoarthritis quite well.  I have recommended topical Voltaren gel to apply to the knee to hopefully get her some relief.  We did discuss that her last injection lasted about 4 months and I would recommend holding off on additional injection for times of more significance when she does need significant pain relief.  Will plan to perform a right knee steroid injection as needed. Plan :    -Plan for right knee ultrasound-guided steroid injection as needed      I personally saw and evaluated the patient, and participated in the management and treatment plan.  Huel Cote, MD Attending Physician, Orthopedic Surgery  This document was dictated using Dragon voice recognition software. A reasonable attempt at proof reading has been made to minimize errors.

## 2022-12-06 ENCOUNTER — Ambulatory Visit
Admission: RE | Admit: 2022-12-06 | Discharge: 2022-12-06 | Disposition: A | Discharge status: Home / Self Care | Payer: Medicare Other | Source: Ambulatory Visit | Attending: Hematology and Oncology | Admitting: Hematology and Oncology

## 2022-12-06 DIAGNOSIS — Z170 Estrogen receptor positive status [ER+]: Secondary | ICD-10-CM

## 2022-12-11 ENCOUNTER — Other Ambulatory Visit: Payer: Medicare Other

## 2022-12-12 ENCOUNTER — Ambulatory Visit
Admission: RE | Admit: 2022-12-12 | Discharge: 2022-12-12 | Disposition: A | Discharge status: Home / Self Care | Payer: Medicare Other | Source: Ambulatory Visit | Attending: Hematology and Oncology | Admitting: Hematology and Oncology

## 2022-12-12 DIAGNOSIS — Z1509 Genetic susceptibility to other malignant neoplasm: Secondary | ICD-10-CM

## 2022-12-26 DIAGNOSIS — F0390 Unspecified dementia without behavioral disturbance: Secondary | ICD-10-CM | POA: Insufficient documentation

## 2022-12-28 NOTE — Progress Notes (Signed)
Patient Care Team: Dois Davenport, MD as PCP - General (Family Medicine) Pershing Proud, RN as Oncology Nurse Navigator Rogelia Boga, Eileen Stanford, RN as Oncology Nurse Navigator Harriette Bouillon, MD as Consulting Physician (General Surgery) Magrinat, Valentino Hue, MD (Inactive) as Consulting Physician (Oncology) Lonie Peak, MD as Attending Physician (Radiation Oncology) Anson Fret, MD as Consulting Physician (Neurology) Marzella Schlein., MD (Ophthalmology) Dorisann Frames, MD as Referring Physician (Endocrinology) Bettey Costa, MD as Consulting Physician (Dermatology)  DIAGNOSIS:  Encounter Diagnosis  Name Primary?   Malignant neoplasm of upper-outer quadrant of left breast in female, estrogen receptor positive (HCC) Yes    SUMMARY OF ONCOLOGIC HISTORY: Oncology History  Malignant neoplasm of upper-outer quadrant of left breast in female, estrogen receptor positive (HCC)  09/11/2020 Initial Diagnosis   Malignant neoplasm of upper-outer quadrant of left breast in female, estrogen receptor positive (HCC)   09/13/2020 Cancer Staging   Staging form: Breast, AJCC 8th Edition - Clinical: Stage IA (cT1a, cN0, cM0, G1, ER+, PR-, HER2-) - Signed by Lowella Dell, MD on 09/13/2020 Stage prefix: Initial diagnosis Histologic grading system: 3 grade system     CHIEF COMPLIANT: Estrogen receptor positive breast cancer; BAP1 mutation on anastrozole.   INTERVAL HISTORY: Sandra Galloway is a 77 y.o with above mentioned. She presents to the clinic today for a follow-up. Pt reports she has been doing well. She denies any pain or discomfort in breast. Tolerating the anastrozole extremely well with no side effects or concerns.   ALLERGIES:  is allergic to amoxicillin and amoxicillin.  MEDICATIONS:  Current Outpatient Medications  Medication Sig Dispense Refill   Turmeric 400 MG CAPS Take 1 tablet by mouth daily.     anastrozole (ARIMIDEX) 1 MG tablet Take 1 tablet (1 mg total) by mouth daily.  90 tablet 4   CALCIUM-VITAMIN D PO Take 1 tablet by mouth 2 (two) times daily.     cyanocobalamin (VITAMIN B12) 1000 MCG/ML injection INJECT 1 ML (1000 MCG) INTRAMUSCULARLY WEEKLY     donepezil (ARICEPT) 10 MG tablet Take 10 mg by mouth at bedtime.     levothyroxine (SYNTHROID) 75 MCG tablet synthroid  75 mcg tabs     LOSARTAN POTASSIUM-HCTZ PO Take 1.5 tablets by mouth daily.     pravastatin (PRAVACHOL) 80 MG tablet Take 80 mg by mouth daily.     Sod Citrate-Citric Acid (CITRIC ACID-SODIUM CITRATE PO) Take by mouth daily as needed.     No current facility-administered medications for this visit.    PHYSICAL EXAMINATION: ECOG PERFORMANCE STATUS: 1 - Symptomatic but completely ambulatory  Vitals:   12/30/22 1203  BP: 135/89  Pulse: 72  Resp: 18  Temp: (!) 97.2 F (36.2 C)  SpO2: 98%   Filed Weights   12/30/22 1203  Weight: 146 lb 1.6 oz (66.3 kg)      LABORATORY DATA:  I have reviewed the data as listed    Latest Ref Rng & Units 12/25/2021    8:39 AM 12/26/2020    8:23 AM 09/13/2020    8:33 AM  CMP  Glucose 70 - 99 mg/dL 87  85  89   BUN 8 - 23 mg/dL 12  12  16    Creatinine 0.44 - 1.00 mg/dL 0.98  1.19  1.47   Sodium 135 - 145 mmol/L 135  140  140   Potassium 3.5 - 5.1 mmol/L 4.2  3.9  4.1   Chloride 98 - 111 mmol/L 102  103  105  CO2 22 - 32 mmol/L 29  28  26    Calcium 8.9 - 10.3 mg/dL 9.2  9.7  8.7   Total Protein 6.5 - 8.1 g/dL 6.8  7.3  7.1   Total Bilirubin 0.3 - 1.2 mg/dL 0.4  0.4  0.3   Alkaline Phos 38 - 126 U/L 55  69  79   AST 15 - 41 U/L 17  20  17    ALT 0 - 44 U/L 14  12  11      Lab Results  Component Value Date   WBC 5.6 12/25/2021   HGB 12.0 12/25/2021   HCT 35.4 (L) 12/25/2021   MCV 91.0 12/25/2021   PLT 313 12/25/2021   NEUTROABS 2.9 12/25/2021    ASSESSMENT & PLAN:  Malignant neoplasm of upper-outer quadrant of left breast in female, estrogen receptor positive (HCC) (1) status post left lumpectomy and axillary lymph node dissection in  1996 for a TX N1 invasive breast cancer, status post adjuvant chemotherapy  [most likely cyclophosphamide and doxorubicin every 3 weeks x 6], followed by adjuvant radiation, then tamoxifen for 5 years   (2) genetics testing 09/11/2020 through the [facility]-Expanded +RNAinsight Panel 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)    (3) left breast upper outer quadrant biopsy 09/04/2020 shows 3 foci of invasive ductal carcinoma, grade 1,  largest 0.15 cm, estrogen receptor strongly positive, progesterone receptor and HER2 negative, with an MIB-1 of 2% = pT1a cN0, stage IA   (4)  lumpectomy with no sentinel lymph node sampling 09/19/2020 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 Breast Exam: 12/30/2022: Benign 2. Mammogram 12/06/2022: Benign, breast density category B    BAP1 mutation: She will need an annual renal ultrasound to evaluate for renal cell cancer. 12/12/2022 renal ultrasound: Benign We will plan abdominal ultrasound and will look at the liver and the kidneys next year.  She follows with her ophthalmologist to rule out uveal melanoma.  She had laser surgery and has really good vision.  Both of her daughters were diagnosed with breast cancer and they have had mastectomies.  They did not detect any gene mutations.   RTC in 1 year    Orders Placed This Encounter  Procedures   US Abdomen Complete    Standing Status:   Future    Standing Expiration Date:   12/30/2023    Order Specific Question:   Reason for exam:    Answer:   Kidney cancer and liver surveillance    Order Specific Question:   Preferred imaging location?    Answer:   [facility]    Order Specific Question:   Release to patient    Answer:   Immediate   The patient has a good understanding of the overall plan. she agrees with it. she will call with any problems that may develop before the next visit here. Total time spent: 30 mins including face to face time and time spent for planning, charting and co-ordination of care   Tamsen Meek, MD 12/30/22    I Janan Ridge am acting as a Neurosurgeon for [employer]  I have reviewed the above documentation for accuracy and completeness, and I agree with the above.

## 2022-12-30 ENCOUNTER — Inpatient Hospital Stay: Payer: Medicare Other | Attending: Hematology and Oncology | Admitting: Hematology and Oncology

## 2022-12-30 VITALS — BP 135/89 | HR 72 | Temp 97.2°F | Resp 18 | Ht 64.0 in | Wt 146.1 lb

## 2022-12-30 DIAGNOSIS — Z170 Estrogen receptor positive status [ER+]: Secondary | ICD-10-CM | POA: Insufficient documentation

## 2022-12-30 DIAGNOSIS — Z803 Family history of malignant neoplasm of breast: Secondary | ICD-10-CM | POA: Diagnosis not present

## 2022-12-30 DIAGNOSIS — Z79811 Long term (current) use of aromatase inhibitors: Secondary | ICD-10-CM | POA: Diagnosis not present

## 2022-12-30 DIAGNOSIS — C50412 Malignant neoplasm of upper-outer quadrant of left female breast: Secondary | ICD-10-CM | POA: Insufficient documentation

## 2022-12-30 MED ORDER — ANASTROZOLE 1 MG PO TABS: 1.0000 mg | ORAL_TABLET | Freq: Every day | ORAL | 4 refills | Status: DC

## 2022-12-30 NOTE — Assessment & Plan Note (Addendum)
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)    (3) left breast upper outer quadrant biopsy 09/04/2020 shows 3 foci of invasive ductal carcinoma, grade 1,  largest 0.15 cm, estrogen receptor strongly positive, progesterone receptor and HER2 negative, with an MIB-1 of 2% = pT1a cN0, stage IA   (4)  lumpectomy with no sentinel lymph node sampling 09/19/2020 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 Breast Exam: 12/30/2022: Benign 2. Mammogram 12/06/2022: Benign, breast density category B    BAP1 mutation: She will need an annual renal ultrasound to evaluate for renal cell cancer. 12/12/2022 renal ultrasound: Benign We will plan abdominal ultrasound and will look at the liver and the kidneys next year.  She follows with her ophthalmologist to rule out uveal melanoma.  She had laser surgery and has really good vision.  Both of her daughters were diagnosed with breast cancer and they have had mastectomies.  They did not detect any gene mutations.   RTC in 1 year

## 2022-12-31 ENCOUNTER — Telehealth: Payer: Self-pay | Admitting: Hematology and Oncology

## 2022-12-31 NOTE — Telephone Encounter (Signed)
Scheduled appointment per 7/15 los. Left voicemail for patient.

## 2023-05-26 ENCOUNTER — Institutional Professional Consult (permissible substitution): Payer: Medicare Other | Admitting: Neurology

## 2023-06-16 IMAGING — DX DG KNEE COMPLETE 4+V*R*
4 series · 4 of 4 positions shown · non-contrast
Comparison: None Available.

CLINICAL DATA: Atraumatic chronic right knee pain.

EXAM:
RIGHT KNEE - COMPLETE 4+ VIEW

[knee ap]
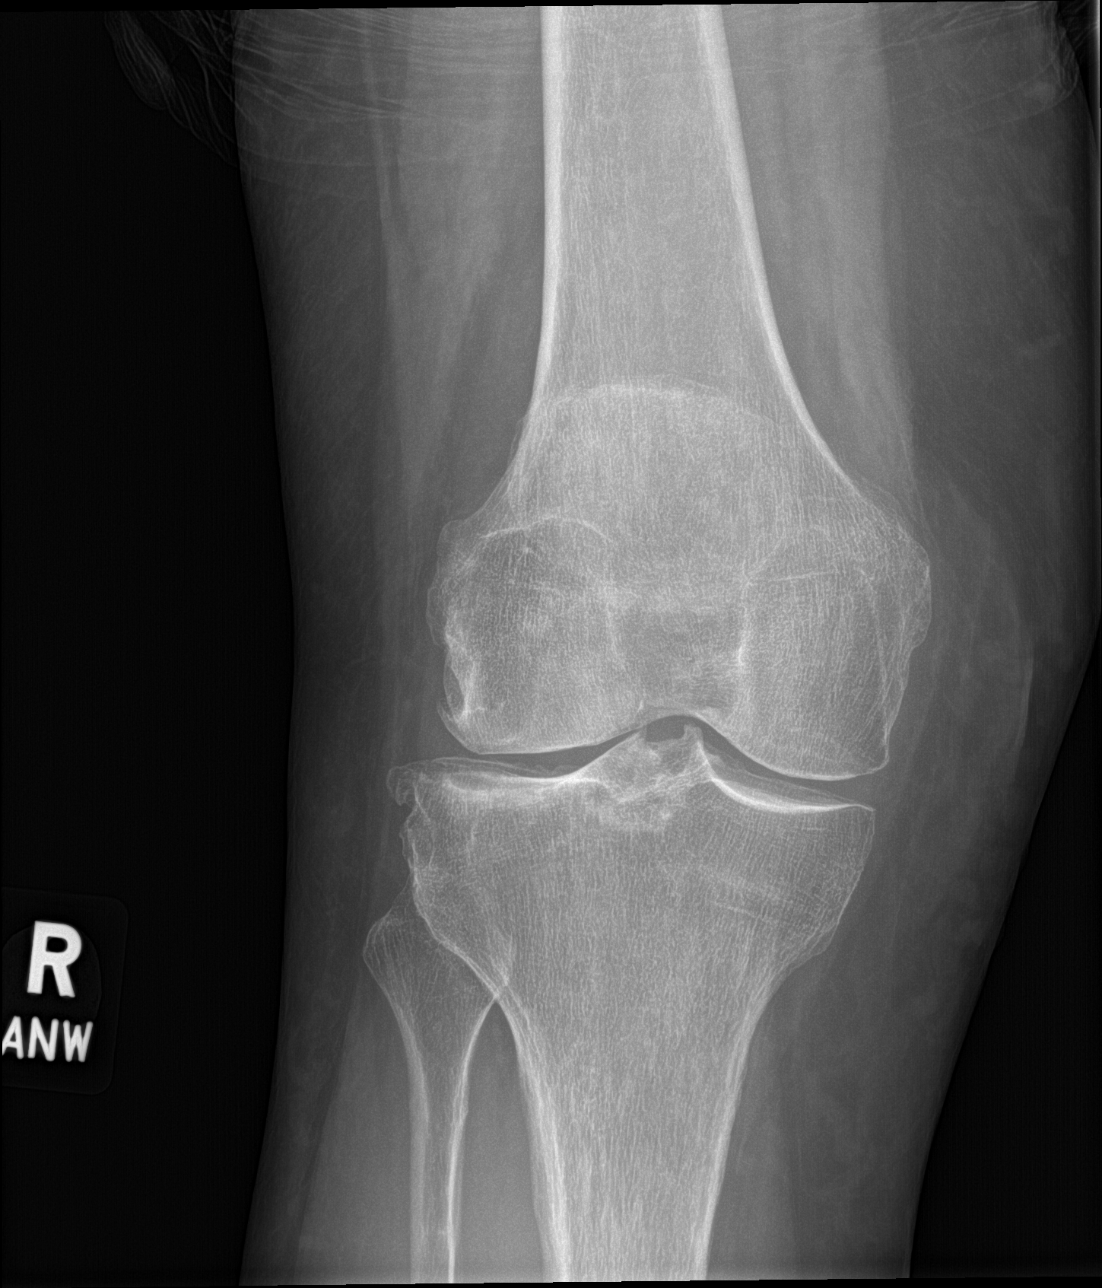

[knee tunnel]
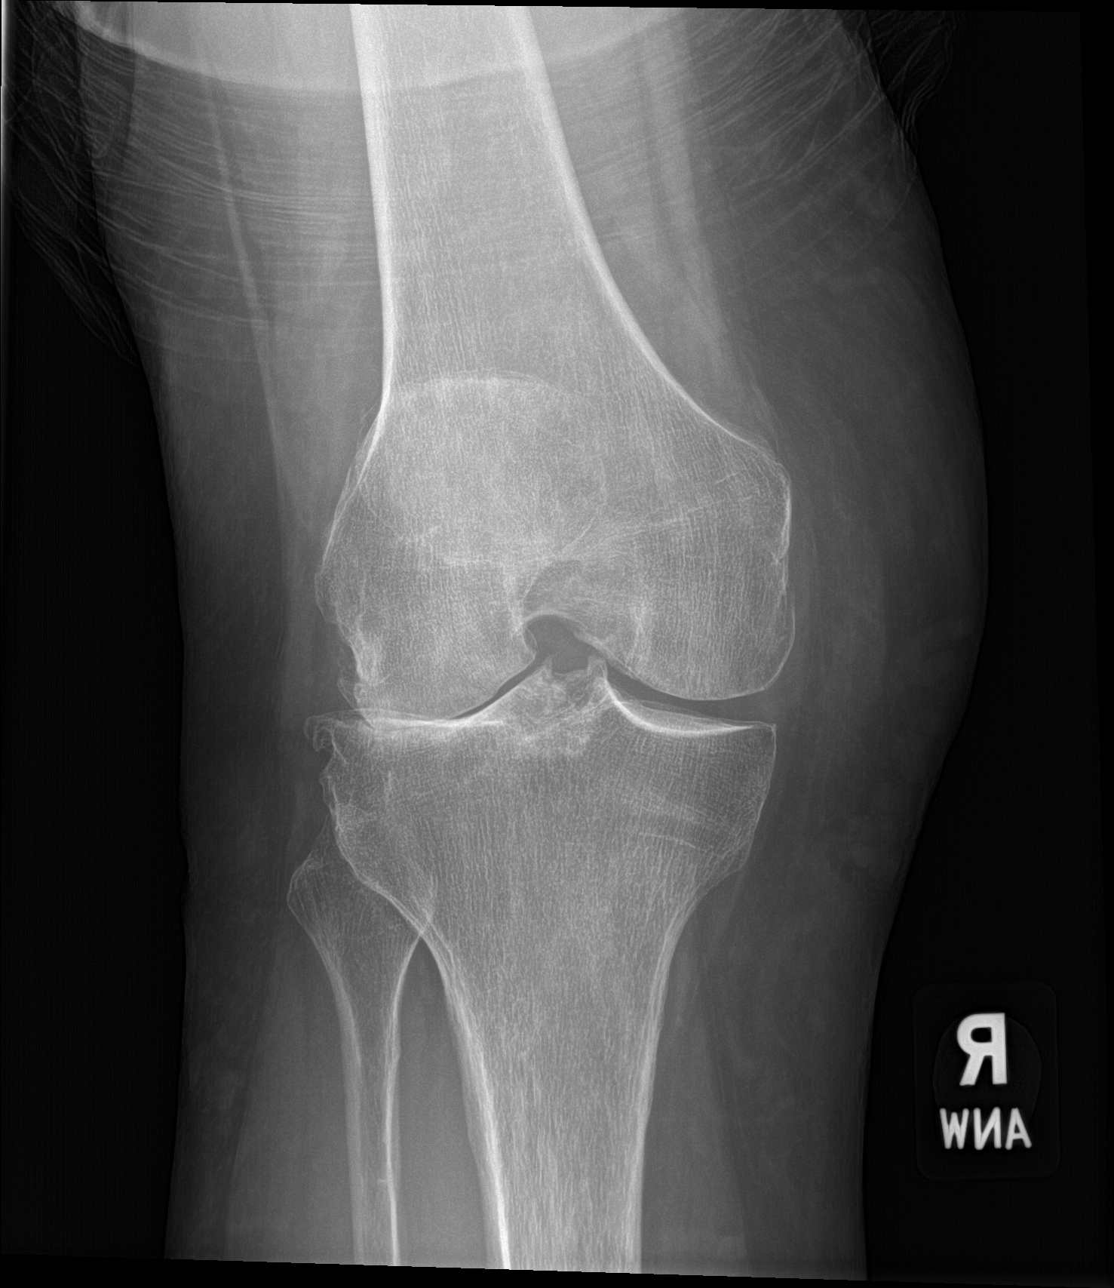

[patella sunrise]
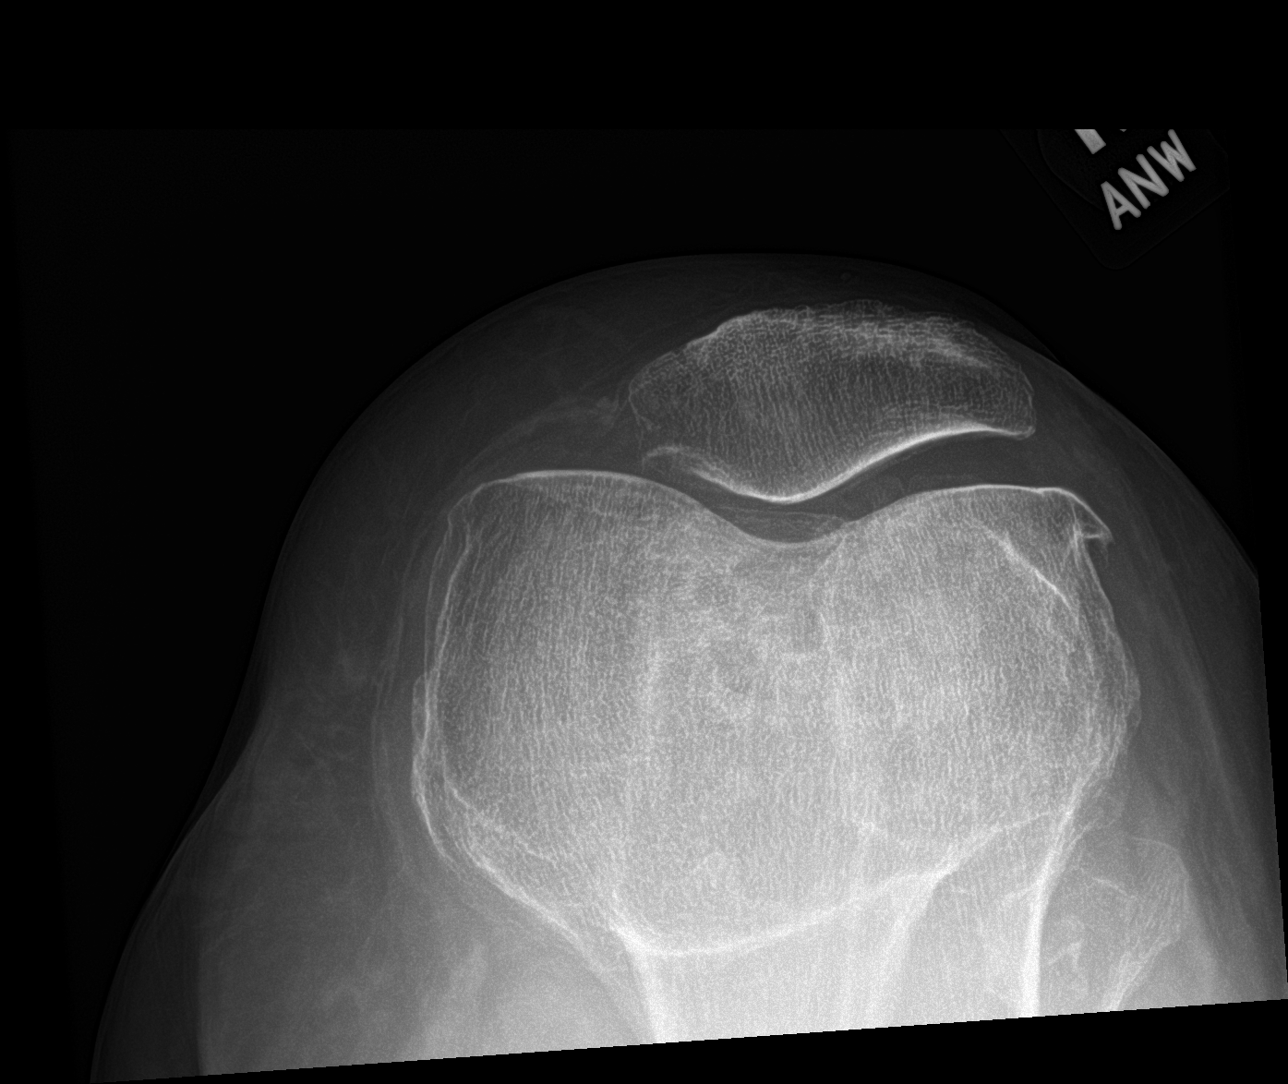

[knee lat]
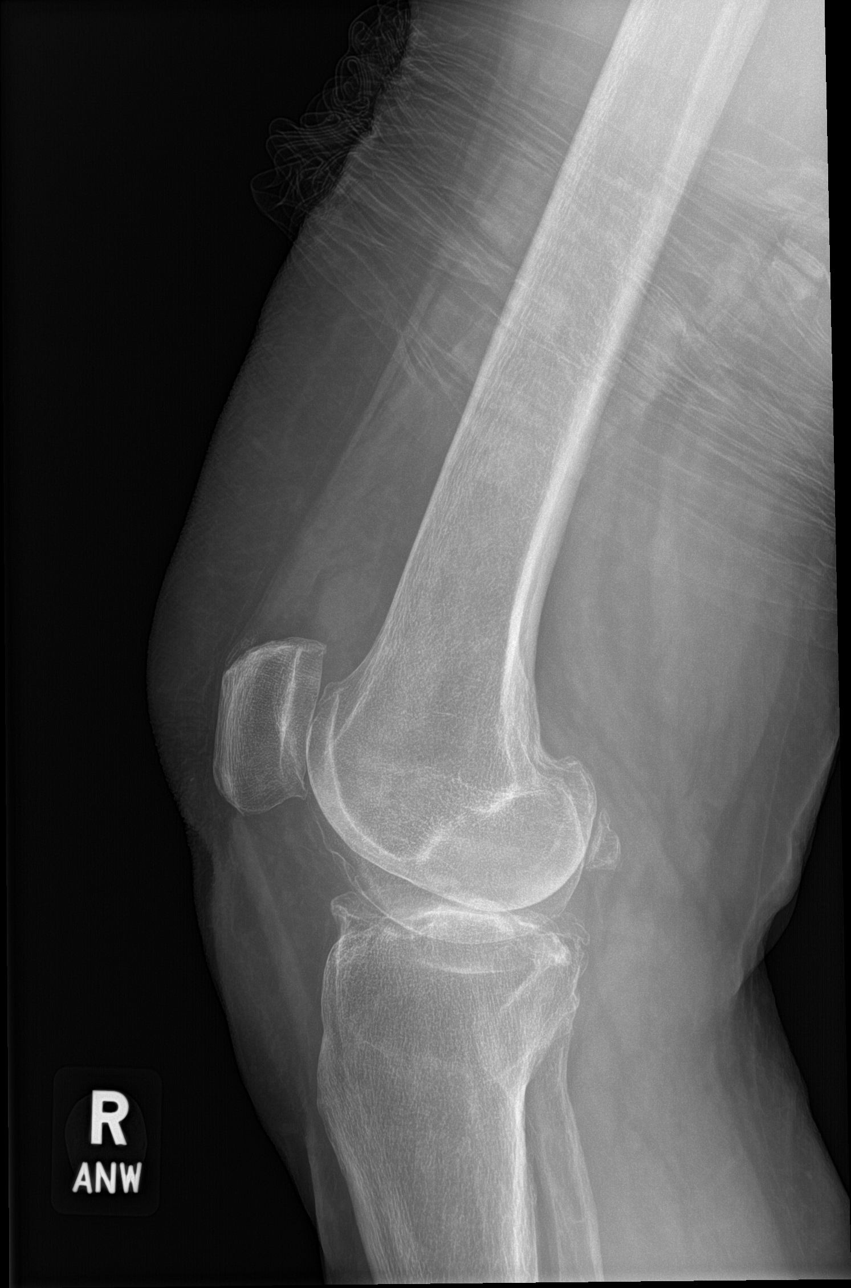

[4 of 4 positions shown; findings below may reference images not displayed]

FINDINGS: No evidence of an acute fracture or dislocation. Predominantly
lateral marginal osteophyte formation is noted. There is moderate
severity medial and lateral tibiofemoral compartment space
narrowing. Moderate severity patellofemoral narrowing is also seen.
A small joint effusion is seen.
IMPRESSION: 1. Moderate severity tricompartmental osteoarthritis of the right
knee.
2. Small joint effusion.

## 2023-06-24 ENCOUNTER — Other Ambulatory Visit: Payer: Self-pay | Admitting: Family Medicine

## 2023-06-24 DIAGNOSIS — R1011 Right upper quadrant pain: Secondary | ICD-10-CM

## 2023-06-26 ENCOUNTER — Ambulatory Visit
Admission: RE | Admit: 2023-06-26 | Discharge: 2023-06-26 | Disposition: A | Discharge status: Home / Self Care | Payer: Medicare Other | Source: Ambulatory Visit | Attending: Family Medicine | Admitting: Family Medicine

## 2023-06-26 DIAGNOSIS — R1011 Right upper quadrant pain: Secondary | ICD-10-CM

## 2023-07-08 ENCOUNTER — Encounter: Payer: Self-pay | Admitting: Physician Assistant

## 2023-07-08 ENCOUNTER — Ambulatory Visit: Payer: Medicare Other | Admitting: Physician Assistant

## 2023-07-08 VITALS — BP 126/72 | HR 72 | Ht 64.0 in | Wt 152.2 lb

## 2023-07-08 DIAGNOSIS — R1011 Right upper quadrant pain: Secondary | ICD-10-CM

## 2023-07-08 DIAGNOSIS — G8929 Other chronic pain: Secondary | ICD-10-CM

## 2023-07-08 DIAGNOSIS — Z860100 Personal history of colon polyps, unspecified: Secondary | ICD-10-CM | POA: Diagnosis not present

## 2023-07-08 NOTE — Progress Notes (Signed)
Chief Complaint: Right upper quadrant pain and vomiting  HPI:    Sandra Galloway is a 78 year old female with a past medical history as listed below including breast cancer, known to Dr. Lavon Paganini, who was referred to me by Dois Davenport, MD for a complaint of right upper quadrant pain and vomiting.      10/17/2020 colonoscopy with 1 less than 1 mm polyp in the cecum, six 4-7 mm polyps in the rectum, transverse colon and ascending colon and moderate diverticulosis in the sigmoid, descending, transverse and ascending colon.  Nonbleeding internal hemorrhoids.  Mixture of tubular adenoma and sessile serrated polyps.  Repeat recommended in 3 years.    12/25/2021 CMP and CBC normal.    06/26/2023 right upper quadrant ultrasound for morning nausea and right upper quadrant pain with a benign 3.0 x 2.6 x 2.4 cm hepatic cyst in the right hepatic lobe.  Gallbladder normal.    Today, the patient presents to clinic and tells me that 3 weeks ago she woke up 4 out of 5 mornings at and nausea and would vomit a yellow bile, the rest of the days were okay, this stopped after 4 days.  She went to see her PCP as she thought maybe this was her gallbladder and had an ultrasound which was okay as above.  Describes that she has had a chronic right upper quadrant pain for the past 2 years, and feels may be slightly more prominent recently.  Does tell me that she walks 3 miles 3 days out of the week and does a stretching class 2 days out of the week, when she is doing her stretching class in certain positions is when she feels this discomfort.  Otherwise she does not notice it.  It is a little bit more prominent when she goes to change positions or pull on her boots.  Again it has been occurring for 2 years.  No association with eating or bowel movements.    Denies fever, chills, weight loss, continued nausea or vomiting, heartburn, reflux or symptoms that awaken her from sleep.  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   Personal history of breast cancer 08/21/2020   Varicose veins     Past Surgical History:  Procedure Laterality Date   BREAST BIOPSY Left 08/2020   BREAST LUMPECTOMY  1996   right breast   BREAST LUMPECTOMY  09/19/2020   COLONOSCOPY  2016   KN-MAC-pre p good-TICS-2 TA/SSP   COLONOSCOPY  10/17/2020   KN   ENDOVENOUS ABLATION SAPHENOUS VEIN W/ LASER Left 07/22/2013   left greater saphenous vein and sclerotherapy (left leg)  Gretta Began MD    ENDOVENOUS ABLATION SAPHENOUS VEIN W/ LASER Right 09/02/2013   right greater saphenous vein and sclerotherapy right  leg  by Gretta Began MD   POLYPECTOMY  2016   TA/SSP   TUBAL LIGATION  1984    Current Outpatient Medications  Medication Sig Dispense Refill   donepezil (ARICEPT) 10 MG tablet Take 10 mg by mouth at bedtime.     anastrozole (ARIMIDEX) 1 MG tablet Take 1 tablet (1 mg total) by mouth daily. 90 tablet 4   CALCIUM-VITAMIN D PO Take 1 tablet by mouth 2 (two) times daily.     cyanocobalamin (VITAMIN B12) 1000 MCG/ML injection INJECT 1 ML (1000 MCG) INTRAMUSCULARLY WEEKLY     levothyroxine (SYNTHROID) 75 MCG tablet synthroid  75 mcg tabs     LOSARTAN POTASSIUM-HCTZ PO Take 1.5 tablets by mouth daily.     pravastatin (PRAVACHOL) 80 MG tablet Take 80 mg by mouth daily.     Sod Citrate-Citric Acid (CITRIC ACID-SODIUM CITRATE PO) Take by mouth daily as needed.     Turmeric 400 MG  CAPS Take 1 tablet by mouth daily.     No current facility-administered medications for this visit.    Allergies as of 07/08/2023 - Review Complete 07/08/2023  Allergen Reaction Noted   Amoxicillin     Amoxicillin Hives 05/19/2022    Family History  Problem Relation Age of Onset   Other Mother        varicose veins   Colon cancer Mother 30   Dementia Mother    Colon polyps Mother 57   Heart disease Father    Hyperlipidemia Father    Heart attack Father    Cancer Father        unknown primary; ? pancreatic   Skin cancer Father    Breast cancer Sister        Ages 88, 51 (two primaries)   Colon polyps Sister 68   Endometrial cancer Maternal Aunt 35   Ovarian cancer Paternal Aunt 7   Ovarian cysts Paternal Aunt    Cancer - Ovarian Paternal Aunt    Breast cancer Cousin 46   Cervical cancer Daughter 5   Ovarian cancer Paternal Aunt 67   Pancreatic cancer Paternal Aunt 11   Breast cancer Cousin 63   Colon polyps Brother 79   Esophageal cancer Neg Hx    Stomach cancer Neg Hx    Rectal cancer Neg Hx     Social History   Socioeconomic History   Marital status: Married    Spouse name: Not on file   Number of children: 3   Years of education: 14   Highest education level: Some college, no degree  Occupational History   Occupation: retired  Tobacco Use   Smoking status: Never   Smokeless tobacco: Never  Vaping Use   Vaping status: Never Used  Substance and Sexual Activity   Alcohol use: Yes    Alcohol/week: 1.0 standard drink of alcohol    Types: 1 Glasses of wine per week    Comment: 1 per month   Drug use: No   Sexual activity: Not on file  Other Topics Concern   Not on file  Social History Narrative   Lives at home with husband in a one story home.  Has 3 children.  Retired from [employer].  Education: some college.     Right handed   Caffeine: 2 cups/day, was more   Social Drivers of Corporate investment banker Strain:  Not on file  Food Insecurity: No Food Insecurity (08/13/2021)   Received [facility]   Hunger Vital Sign    Worried About Running Out of Food in the Last Year: Never true    Ran Out of Food in the Last Year: Never true  Transportation Needs: No Transportation Needs (09/13/2020)   PRAPARE - Administrator, Civil Service (Medical): No    Lack of Transportation (Non-Medical): No  Physical Activity: Not on file  Stress: No Stress Concern Present (09/14/2020)   Received from [facility], [facility] - Occupational Stress Questionnaire    Feeling of Stress : Only a little  Social Connections: Unknown (10/29/2021)   Received from [facility], [facility]   Social Network    Social Network: Not on file  Intimate Partner Violence: Unknown (09/18/2021)   Received from [facility], [facility]   HITS    Physically Hurt: Not on file    Insult or Talk Down To: Not on file    Threaten Physical Harm: Not on file    Scream or Curse: Not on file    Review of Systems:    Constitutional: No weight loss, fever or chills  Cardiovascular: No chest pain   Respiratory: No SOB Gastrointestinal: See HPI and otherwise negative   Physical Exam:  Vital signs: BP 126/72   Pulse 72   Ht 5\' 4"  (1.626 m)   Wt 152 lb 4 oz (69.1 kg)   SpO2 96%   BMI 26.13 kg/m    Constitutional:   Pleasant Caucasian female appears to be in NAD, Well developed, Well nourished, alert and cooperative Respiratory: Respirations even and unlabored. Lungs clear to auscultation bilaterally.   No wheezes, crackles, or rhonchi.  Cardiovascular: Normal S1, S2. No MRG. Regular rate and rhythm. No peripheral edema, cyanosis or pallor.  Gastrointestinal:  Soft, nondistended, nontender. No rebound or guarding. Normal bowel sounds. No appreciable masses or hepatomegaly. Rectal:  Not performed.  Psychiatric: Oriented to person, place and time.  Demonstrates good judgement and reason without abnormal affect or behaviors.  RELEVANT LABS AND IMAGING: CBC    Component Value Date/Time   WBC 5.6 12/25/2021 0839   RBC 3.89 12/25/2021 0839   HGB 12.0 12/25/2021 0839   HCT 35.4 (L) 12/25/2021 0839   PLT 313 12/25/2021 0839   MCV 91.0 12/25/2021 0839   MCH 30.8 12/25/2021 0839   MCHC 33.9 12/25/2021 0839   RDW 13.9 12/25/2021 0839   LYMPHSABS 1.7 12/25/2021 0839   MONOABS 0.7 12/25/2021 0839   EOSABS 0.2 12/25/2021 0839   BASOSABS 0.0 12/25/2021 0839    CMP     Component Value Date/Time   NA 135 12/25/2021 0839   K 4.2 12/25/2021 0839   CL 102 12/25/2021 0839   CO2 29 12/25/2021 0839   GLUCOSE 87 12/25/2021 0839   BUN 12 12/25/2021 0839   CREATININE 0.80 12/25/2021 0839   CALCIUM 9.2 12/25/2021 0839   PROT 6.8 12/25/2021 0839   ALBUMIN 4.0 12/25/2021 0839   AST 17 12/25/2021 0839   ALT 14 12/25/2021 0839   ALKPHOS 55 12/25/2021 0839   BILITOT 0.4 12/25/2021 0839   GFRNONAA >60 12/25/2021 0839    Assessment: 1.  Right upper quadrant pain: 4 days of nausea with vomiting 3 weeks ago, none since then, chronic right upper quadrant pain for the past 2 years only felt when stretching or in certain positions, some prominence lately, no change with eating etc, recently normal ultrasound; most likely musculoskeletal 2.  History of colonic  polyps: Repeat colonoscopy recommended in May of this year, though patient is now 41, we will make sure she is in recall to discuss  Plan: 1.  Discussed with patient that likely the vomiting was unrelated to this chronic right upper quadrant pain.  Maybe viral? 2.  Chronic right upper quadrant pain only felt when stretching in certain positions, no association with bowel movements or eating.  Most likely musculoskeletal.  Patient would like to hold on further workup.  Discussed with her that if the pain becomes more prominent or she notices an association with eating or defecation then she needs  to call and let us know.  Otherwise would be happy to refer her to Dr. Terrilee Files for further evaluation.  She declines for now. 3.  Patient to follow in clinic with [place] as needed.  Hyacinth Meeker, PA-C [facility] 07/08/2023, 9:28 AM  Cc: Dois Davenport, MD

## 2023-07-08 NOTE — Patient Instructions (Signed)
_______________________________________________________  If your blood pressure at your visit was 140/90 or greater, please contact your primary care physician to follow up on this.  _______________________________________________________  If you are age 77 or older, your body mass index should be between 23-30. Your Body mass index is 26.13 kg/m. If this is out of the aforementioned range listed, please consider follow up with your Primary Care Provider.  If you are age 107 or younger, your body mass index should be between 19-25. Your Body mass index is 26.13 kg/m. If this is out of the aformentioned range listed, please consider follow up with your Primary Care Provider.   ________________________________________________________  The [facility] providers would like to encourage you to use [facility] to communicate with providers for non-urgent requests or questions.  Due to long hold times on the telephone, sending your provider a message by [facility] may be a faster and more efficient way to get a response.  Please allow 48 business hours for a response.  Please remember that this is for non-urgent requests.  _______________________________________________________  Follow up as needed. Call with any questions or concerns.  It was a pleasure to see you today!  Thank you for trusting me with your gastrointestinal care!

## 2023-09-22 ENCOUNTER — Encounter: Payer: Self-pay | Admitting: Neurology

## 2023-09-22 ENCOUNTER — Ambulatory Visit (INDEPENDENT_AMBULATORY_CARE_PROVIDER_SITE_OTHER): Payer: Medicare Other | Admitting: Neurology

## 2023-09-22 VITALS — BP 117/79 | HR 65 | Ht 64.5 in | Wt 164.4 lb

## 2023-09-22 DIAGNOSIS — R441 Visual hallucinations: Secondary | ICD-10-CM | POA: Diagnosis not present

## 2023-09-22 DIAGNOSIS — R430 Anosmia: Secondary | ICD-10-CM

## 2023-09-22 DIAGNOSIS — E519 Thiamine deficiency, unspecified: Secondary | ICD-10-CM

## 2023-09-22 DIAGNOSIS — R413 Other amnesia: Secondary | ICD-10-CM | POA: Diagnosis not present

## 2023-09-22 NOTE — Progress Notes (Signed)
 [facility]    Provider:  Dr Lucia Gaskins Requesting Provider: Dois Davenport, MD Primary Care Provider:  Dois Davenport, MD  CC:  Anosmia, memory changes  HPI:  Sandra Galloway is a 78 y.o. female here as requested by Dois Davenport, MD for memory. has Thyroid nodule, uninodular; Varicose veins of bilateral lower extremities with other complications; Chronic venous insufficiency; Anosmia; Personal history of breast cancer; Family history of breast cancer; Family history of colon cancer; Family history of pancreatic cancer; Family history of uterine cancer; Malignant neoplasm of upper-outer quadrant of left breast in female, estrogen receptor positive (HCC); Genetic testing; Monoallelic mutation of BAP1 gene; Elevated blood pressure reading; Chronic low back pain; Closed displaced fracture of middle phalanx of right ring finger with routine healing; Dementia (HCC); Hypercholesterolemia; Flu-like symptoms; Hyperlipemia; Left knee pain; Right knee pain; Multiple thyroid nodules; Need for hepatitis C screening test; Need for vaccination with 13-polyvalent pneumococcal conjugate vaccine; Osteoporosis; Tachycardia; Taking a statin medication; Preventative health care; Malignant tumor of breast (HCC); Recurrent cancer of left breast (HCC); Disorder of thyroid gland; and Hypothyroidism on their problem list.   She is on donepezil and memantine. Here with daughter. Who provides information. They recently moved to [facility]. Her husband has parkinson's disease. Diagnosed in 2011 and recently progressing more quickly. She feels less stressed in [place]. She feels well, she drives, she manages all her own medications, her mother had dementia and mother was seen at [facility] and not diagnosed with alzheimer's disease, patient states it was due to a head injury. Daughter is here and feels mother is aging. Daughter states patient's husband is concerned about her missing information , daughter doesn't see  it but is unsure, husband had parkinson's for 10 years unclear if he is evaluating patient properly but her husband sees her every single day and states he is concerned. Patient states he mentioned she changed the subject and that was concerning to him in the middle of a conversation. Wife is managing the bills now and not missing anything, she calculated there may be missing $3.84 on a bill she is not spending more time on this for a few dollars. She is not driving any worse then before, she has never been the best driver, not getting lost, can drive to [place] and to daughter's house in A few times she states she saw a photo move, daughter was laughing in the picture and the people seemed to move, last time that happened was 2 months ago and it happens when waking up from a nap, otherwise no hallucinations or delusions. She has tripped a few times on the carpeting but no gait abnormalities. B12 deficiency and get monthly injections, long term doing that for a year.   Reviewed notes, labs and imaging from outside physicians, which showed:  MRI brain 2020: EXAM: MRI HEAD WITHOUT CONTRAST   TECHNIQUE: Multiplanar, multiecho pulse sequences of the brain and surrounding structures were obtained without intravenous contrast.   COMPARISON:  None.   FINDINGS: Brain: No restricted diffusion to suggest acute infarction. No midline shift, mass effect, evidence of mass lesion, ventriculomegaly, extra-axial collection or acute intracranial hemorrhage. Cervicomedullary junction and pituitary are within normal limits.   Olfactory findings are below.   Mild for age scattered nonspecific cerebral white matter T2 and FLAIR hyperintensity. No cortical encephalomalacia or chronic blood products. Mildly dilated perivascular spaces in the basal ganglia (normal variant).   Vascular: Major intracranial vascular flow voids are preserved. Tortuous distal vertebral arteries.  Skull and upper cervical  spine: Negative visible cervical spine. Visualized bone marrow signal is within normal limits.   Sinuses/Orbits: Negative orbits. Paranasal sinuses are well pneumatized. Mastoid air cells are clear. Visible internal auditory structures appear normal.   Other: There is susceptibility artifact in the left face probably due to dental hardware.   The olfactory bulbs are difficult to visualize despite thin slice imaging (series 13, image 15). The inferior frontal gyri otherwise appear normal. Unremarkable nasal cavity, mild rightward septal deviation.   IMPRESSION: 1. Both olfactory bulbs are difficult to visualize and may be atrophied. 2. Otherwise largely unremarkable for age noncontrast MRI appearance of the brain; mild white matter signal changes which are most commonly due to small vessel disease.     Electronically Signed   By: Odessa Fleming M.D.   On: 01/27/2019 03:2  05/19/2022: CT Head EXAM: CT HEAD WITHOUT CONTRAST   TECHNIQUE: Contiguous axial images were obtained from the base of the skull through the vertex without intravenous contrast.   RADIATION DOSE REDUCTION: This exam was performed according to the departmental dose-optimization program which includes automated exposure control, adjustment of the mA and/or kV according to patient size and/or use of iterative reconstruction technique.   COMPARISON:  None Available.   FINDINGS: Brain: There is mild cerebral atrophy with widening of the extra-axial spaces and ventricular dilatation. There are areas of decreased attenuation within the white matter tracts of the supratentorial brain, consistent with microvascular disease changes.   Vascular: No hyperdense vessel or unexpected calcification.   Skull: Normal. Negative for fracture or focal lesion.   Sinuses/Orbits: No acute finding.   Other: None.   IMPRESSION: 1. No acute intracranial abnormality. 2. Generalized cerebral atrophy and microvascular disease  changes of the supratentorial brain.    HPI 07/20/2020:  Sandra Galloway is a 78 y.o. female here as requested by Dois Davenport, MD for anosmia and low hearing.  Past medical history hypothyroidism, hyperlipidemia, allergic rhinitis, asthma, unspecified fall, basal cell carcinoma skin of the nose, hypertension.  I reviewed notes from [facility]: Anosmia and low hearing, patient requesting neurology opinion for reassurance, at last appointment patient was evaluated for elevated blood pressure readings, no personal history of hypertension however strong family history of hypertension, at the time she denied headache, visual changes, chest pain, shortness of breath or focal neurologic deficits.  However she did note anosmia and low hearing, she has never experienced a COVID-19 infection, she does relate a recent increase in sodium in her diet, less activity than typical, I reviewed Dr. Wendee Copp examination which showed normal head, eyes, ears, nose, throat, neck, chest, heart, abdomen, back, extremities, neuro and skin.  Her husband has parkinson's disease and she is worried because of her anosmia. She has not had rem sleep disorder. Her mother passed away at 21 with dementia. She was tested at [facility] and diagnosed with dementia as a result of head injury and not alzheimers. Her recall is not as good. She can recall names but it takes her a minute. Her hearing loss was gradualy. Smell was very quickly affected. About 3 years, she cannot smell things, almost no sense of smell, She still has a good appetite, taste is affected but still enjoys food, no tremor, no falls, no difficulty swallowing, no dizziness or vertigo, walking is fine. No head trauma. No sinus infection. She has not seen ENT to make sure there is nothing structural or polyps. It takes her a minute to think of a  word sometimes. She cares for her husband who has parkinson's disease, he is driving, she is driving, she has not had any  problems managing money, not getting lost, no on ein the family has expressed concerns for her memory. She has an older brother who has no dementia. Sister is fine as well. Sheis on synthroid and follows that with pcp, her pcp also put her on vitamin D supplementation. She compensates with her memory. No other focal neurologic deficits, associated symptoms, inciting events or modifiable factors.  Reviewed notes, labs and imaging from outside physicians, which showed:  01/26/2019; MRI brain for loss of smell for 18 months.   Personally reviewed images and agree with the following:   Brain: No restricted diffusion to suggest acute infarction. No midline shift, mass effect, evidence of mass lesion, ventriculomegaly, extra-axial collection or acute intracranial hemorrhage. Cervicomedullary junction and pituitary are within normal limits.   Olfactory findings are below.   Mild for age scattered nonspecific cerebral white matter T2 and FLAIR hyperintensity. No cortical encephalomalacia or chronic blood products. Mildly dilated perivascular spaces in the basal ganglia (normal variant).   Vascular: Major intracranial vascular flow voids are preserved. Tortuous distal vertebral arteries.   Skull and upper cervical spine: Negative visible cervical spine. Visualized bone marrow signal is within normal limits.   Sinuses/Orbits: Negative orbits. Paranasal sinuses are well pneumatized. Mastoid air cells are clear. Visible internal auditory structures appear normal.   Other: There is susceptibility artifact in the left face probably due to dental hardware.   The olfactory bulbs are difficult to visualize despite thin slice imaging (series 13, image 15). The inferior frontal gyri otherwise appear normal. Unremarkable nasal cavity, mild rightward septal deviation.   IMPRESSION: 1. Both olfactory bulbs are difficult to visualize and may be atrophied. 2. Otherwise largely unremarkable for age  noncontrast MRI appearance of the brain; mild white matter signal changes which are most commonly due to small vessel disease.  Review of Systems: Patient complains of symptoms per HPI as well as the following symptoms: anosmia. Pertinent negatives and positives per HPI. All others negative.   Social History   Socioeconomic History   Marital status: Married    Spouse name: Not on file   Number of children: 3   Years of education: 14   Highest education level: Some college, no degree  Occupational History   Occupation: retired  Tobacco Use   Smoking status: Never   Smokeless tobacco: Never  Vaping Use   Vaping status: Never Used  Substance and Sexual Activity   Alcohol use: Yes    Alcohol/week: 1.0 standard drink of alcohol    Types: 1 Glasses of wine per week    Comment: 1 per month   Drug use: No   Sexual activity: Not on file  Other Topics Concern   Not on file  Social History Narrative   Lives at home with husband in a [place] .  Has 3 children.  Retired from [employer].  Education: some college.     Right handed   Caffeine: 2 cups/day, was more.    Social Drivers of Corporate investment banker Strain: Not on file  Food Insecurity: No Food Insecurity (08/13/2021)   Received from [facility], [facility]   Hunger Vital Sign    Worried About Running Out of Food in the Last Year: Never true    Ran Out of Food in the Last Year: Never true  Transportation Needs: No  Transportation Needs (09/13/2020)   PRAPARE - Administrator, Civil Service (Medical): No    Lack of Transportation (Non-Medical): No  Physical Activity: Not on file  Stress: No Stress Concern Present (09/14/2020)   Received from [facility], [facility] - Occupational Stress Questionnaire    Feeling of Stress : Only a little  Social Connections: Unknown (10/29/2021)   [facility] from [facility]   Social Network    Social Network: Not on file  Intimate Partner Violence: Unknown (09/18/2021)   Received from [facility], [facility]   HITS    Physically Hurt: Not on file    Insult or Talk Down To: Not on file    Threaten Physical Harm: Not on file    Scream or Curse: Not on file    Family History  Problem Relation Age of Onset   Other Mother        varicose veins   Colon cancer Mother 73   Dementia Mother    Colon polyps Mother 42   Heart disease Father    Hyperlipidemia Father    Heart attack Father    Cancer Father        unknown primary; ? pancreatic   Skin cancer Father    Breast cancer Sister        Ages 19, 41 (two primaries)   Colon polyps Sister 97   Colon polyps Brother 60   Cervical cancer Daughter 24   Breast cancer Daughter    Breast cancer Daughter    Endometrial cancer Maternal Aunt 35   Ovarian cancer Paternal Aunt 79   Ovarian cysts Paternal Aunt    Cancer - Ovarian Paternal Aunt    Ovarian cancer Paternal Aunt 38   Pancreatic cancer Paternal Aunt 97   Breast cancer Cousin 64   Breast cancer Cousin 79   Esophageal cancer Neg Hx    Stomach cancer Neg Hx    Rectal cancer Neg Hx     Past Medical History:  Diagnosis Date   Allergic rhinitis    Arthritis    generalized   Basal cell carcinoma (BCC) of skin of nose    Breast cancer (HCC)    LEFT   Cancer (HCC)    LEFT breast   Cataract    RIGHT eye-not a surgical candidate at this time-   Effusion, right knee    Essential (primary) hypertension    on meds   Family history of breast cancer 08/21/2020   Family history of colon cancer 08/21/2020   Family history of pancreatic cancer 08/21/2020   Family history of uterine cancer 08/21/2020   Hyperlipidemia    on meds   Hypothyroidism    on meds   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   Personal history of breast cancer 08/21/2020   Varicose veins     Patient Active Problem List   Diagnosis Date Noted   Dementia (HCC)  12/26/2022   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   Genetic testing 09/12/2020   Osteoporosis 09/12/2020   Recurrent cancer of left breast (HCC) 09/12/2020   Malignant neoplasm of upper-outer quadrant of left breast in female, estrogen receptor positive (HCC) 09/11/2020   Personal history of breast cancer 08/21/2020   Family history of breast cancer 08/21/2020   Family history of colon cancer 08/21/2020   Family history of pancreatic cancer 08/21/2020   Family history of uterine cancer 08/21/2020  Anosmia 03/13/2018   Closed displaced fracture of middle phalanx of right ring finger with routine healing 04/28/2017   Flu-like symptoms 08/20/2016   Chronic low back pain 04/23/2016   Left knee pain 10/27/2015   Right knee pain 10/27/2015   Multiple thyroid nodules 10/27/2015   Need for hepatitis C screening test 10/27/2015   Need for vaccination with 13-polyvalent pneumococcal conjugate vaccine 10/27/2015   Hypercholesterolemia 09/27/2015   Disorder of thyroid gland 09/27/2015   Tachycardia 07/25/2014   Taking a statin medication 07/25/2014   Elevated blood pressure reading 12/22/2013   Hyperlipemia 12/22/2013   Preventative health care 12/22/2013   Hypothyroidism 12/22/2013   Varicose veins of bilateral lower extremities with other complications 01/29/2013   Chronic venous insufficiency 01/29/2013   Thyroid nodule, uninodular 08/10/2012   Malignant tumor of breast (HCC) 06/17/1993    Past Surgical History:  Procedure Laterality Date   BREAST BIOPSY Left 08/2020   BREAST LUMPECTOMY  1996   right breast   BREAST LUMPECTOMY Right 09/19/2020   COLONOSCOPY  2016   KN-MAC-pre p good-TICS-2 TA/SSP   COLONOSCOPY  10/17/2020   KN   ENDOVENOUS ABLATION SAPHENOUS VEIN W/ LASER Left 07/22/2013   left greater saphenous vein and sclerotherapy (left leg)  Gretta Began MD    ENDOVENOUS ABLATION SAPHENOUS VEIN W/ LASER Right 09/02/2013   right greater saphenous vein and  sclerotherapy right leg  by Gretta Began MD   POLYPECTOMY  2016   TA/SSP   TUBAL LIGATION  1984    Current Outpatient Medications  Medication Sig Dispense Refill   anastrozole (ARIMIDEX) 1 MG tablet Take 1 tablet (1 mg total) by mouth daily. 90 tablet 4   CALCIUM-VITAMIN D PO Take 1 tablet by mouth 2 (two) times daily.     cyanocobalamin (VITAMIN B12) 1000 MCG/ML injection INJECT 1 ML (1000 MCG) INTRAMUSCULARLY WEEKLY     donepezil (ARICEPT) 10 MG tablet Take 10 mg by mouth at bedtime.     Ferrous Sulfate (IRON PO) Take 1 tablet by mouth daily.     levothyroxine (SYNTHROID) 75 MCG tablet synthroid  75 mcg tabs     LOSARTAN POTASSIUM-HCTZ PO Take 1.5 tablets by mouth daily.     losartan-hydrochlorothiazide (HYZAAR) 50-12.5 MG tablet Take 1 tablet by mouth daily.     memantine (NAMENDA) 10 MG tablet Take 10 mg by mouth 2 (two) times daily.     montelukast (SINGULAIR) 10 MG tablet Take 10 mg by mouth at bedtime.     olmesartan-hydrochlorothiazide (BENICAR HCT) 20-12.5 MG tablet Take 1 tablet by mouth daily.     oseltamivir (TAMIFLU) 75 MG capsule Take 75 mg by mouth 2 (two) times daily.     pravastatin (PRAVACHOL) 80 MG tablet Take 80 mg by mouth daily.     predniSONE (DELTASONE) 10 MG tablet Take by mouth.     rosuvastatin (CRESTOR) 20 MG tablet Take 20 mg by mouth at bedtime.     Sod Citrate-Citric Acid (CITRIC ACID-SODIUM CITRATE PO) Take by mouth daily as needed.     Turmeric 400 MG CAPS Take 1 tablet by mouth daily.     No current facility-administered medications for this visit.    Allergies as of 09/22/2023 - Review Complete 09/22/2023  Allergen Reaction Noted   Amoxicillin     Amoxicillin Hives 05/19/2022    Vitals: BP 117/79 (Cuff Size: Normal)   Pulse 65   Ht 5' 4.5" (1.638 m)   Wt 164 lb 6.4 oz (74.6 kg)  BMI 27.78 kg/m  Last Weight:  Wt Readings from Last 1 Encounters:  09/22/23 164 lb 6.4 oz (74.6 kg)   Last Height:   Ht Readings from Last 1 Encounters:   09/22/23 5' 4.5" (1.638 m)   Physical exam: Exam: Gen: NAD, conversant, well nourised, obese, well groomed                     CV: RRR, no MRG. No Carotid Bruits. No peripheral edema, warm, nontender Eyes: Conjunctivae clear without exudates or hemorrhage  Neuro: Detailed Neurologic Exam  Speech:    Speech is normal; fluent and spontaneous with normal comprehension.  Cognition:  moca 21/30    09/22/2023   10:22 AM 03/13/2018    8:56 AM  Montreal Cognitive Assessment   Visuospatial/ Executive (0/5) 4 5  Naming (0/3) 3 3  Attention: Read list of digits (0/2) 0 2  Attention: Read list of letters (0/1) 1 1  Attention: Serial 7 subtraction starting at 100 (0/3) 2 3  Language: Repeat phrase (0/2) 2 2  Language : Fluency (0/1) 0 1  Abstraction (0/2) 2 2  Delayed Recall (0/5) 1 4  Orientation (0/6) 6 6  Total 21 29  Adjusted Score (based on education)  29       The patient is oriented to person, place, and time;     recent and remote memory intact;     language fluent;     normal attention, concentration, fund of knowledge Cranial Nerves:    The pupils are equal, round, and reactive to light. Pupils too small to visualize fundi. Visual fields are full to finger confrontation. Extraocular movements are intact. Trigeminal sensation is intact and the muscles of mastication are normal. The face is symmetric. The palate elevates in the midline. Hearing intact. Voice is normal. Shoulder shrug is normal. The tongue has normal motion without fasciculations.   Coordination: nml  Gait: nml  Motor Observation:    No asymmetry, no atrophy, and no involuntary movements noted. Tone:    Normal muscle tone.    Posture:    Posture is normal. normal erect    Strength:    Strength is V/V in the upper and lower limbs.      Sensation: intact to LT     Reflex Exam:  DTR's:    Deep tendon reflexes in the upper and lower extremities are symmetrical bilaterally.   Toes:    The toes  are downgoing bilaterally.   Clonus:    Clonus is absent.    Assessment/Plan:  77 y.o. female here as requested by Dois Davenport, MD for memory. has Thyroid nodule, uninodular; Varicose veins of bilateral lower extremities with other complications; Chronic venous insufficiency; Anosmia; Personal history of breast cancer; Family history of breast cancer; Family history of colon cancer; Family history of pancreatic cancer; Family history of uterine cancer; Malignant neoplasm of upper-outer quadrant of left breast in female, estrogen receptor positive (HCC); Genetic testing; Monoallelic mutation of BAP1 gene; Elevated blood pressure reading; Chronic low back pain; Closed displaced fracture of middle phalanx of right ring finger with routine healing; Dementia (HCC); Hypercholesterolemia; Flu-like symptoms; Hyperlipemia; Left knee pain; Right knee pain; Multiple thyroid nodules; Need for hepatitis C screening test; Need for vaccination with 13-polyvalent pneumococcal conjugate vaccine; Osteoporosis; Tachycardia; Taking a statin medication; Preventative health care; Malignant tumor of breast (HCC); Recurrent cancer of left breast (HCC); Disorder of thyroid gland; and Hypothyroidism on their problem list.  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 Will order formal neurocognitive testing. We discussed repeat MRI of the brain for reversible cause of memory loss. If MCI suspicious for Alzheimer's etiology may consider further testing pending results of formal neurocognitive testing.  Recommended "The XX Brain" book and The MIND diet [employer]).  Orders Placed This Encounter  Procedures   MR BRAIN W WO CONTRAST   CBC with Differential/Platelets   Comprehensive metabolic panel with GFR   Vitamin B1   No orders of the defined types were placed in this encounter.   Cc: Dois Davenport, MD,  Dois Davenport, MD  Naomie Dean, MD  [facility] [address] [place], [place] [address]  Phone [phone] Fax [phone]

## 2023-09-22 NOTE — Patient Instructions (Addendum)
 MRi brain w/wo contrast Formal neurocognitive testing  Sandra Galloway  F/u 4 months   Dementia Dementia is a condition that affects the way the brain works. It often affects thinking and memory.  There are many types of dementia, including: Alzheimer's disease. This is the most common type. Vascular dementia. This type may happen due to a stroke. Lewy body dementia. This type may happen to people who have Parkinson's disease. Frontotemporal dementia. This type is caused by damage to nerve cells in certain parts of the brain. Some people may have more than one type. What are the causes? Dementia is caused by damage to cells in the brain. Some causes that can't be reversed include: Having a condition that affects the blood vessels of the brain. This may be diabetes or heart disease. Changes to genes. Some causes that can be reversed or slowed down include: Injury to the brain due to: A growth called a tumor. A blood clot. Too much fluid in the brain. Taking certain medicines. An infection. Problems with your thyroid. Not having enough vitamin B12 in the body. Having a disease that causes your body's defense system, called the immune system, to attack healthy parts of your body. What are the signs or symptoms? Symptoms of dementia start slowly and get worse with time. They may include: Problems remembering events or people. Getting lost easily. Forgetting appointments or to pay bills. Having trouble taking a bath or putting clothes on. Having trouble planning and making meals. Having trouble speaking. Changes in behavior or mood. How is this diagnosed? Dementia may be diagnosed based on: Your symptoms and medical history. A physical exam. Tests. These may include: Tests to check your thinking and memory to see how your brain is working. Lab tests. You may have tests on your blood or pee (urine). Imaging tests, such as a CT scan, a PET scan, or an MRI. Genetic testing. This  may be done if other family members have had dementia. Your health care provider will talk with you and your family, friends, or caregivers about your history and symptoms. How is this treated? Treatment depends on the cause of the dementia and should start as soon as possible. It might include: Taking medicines for symptoms. Taking medicines to help control or slow down the dementia. Treating the cause of your dementia. Your provider can help you find support groups and other members of the health care team who can help with your care. Follow these instructions at home: Medicines Take medicines only as told by your provider. Use a pill organizer or pill reminder to help you keep track of your medicines. Avoid taking medicines for pain or for sleep. These can affect your thinking. Lifestyle Make healthy choices. Be active as told by your provider. Do not smoke, vape, or use products with nicotine or tobacco in them. If you need help quitting, talk with your provider. Do not drink alcohol. When you feel a lot of stress, do something that helps you relax. Your provider can give you tips. Spend time with other people. Make sure you get good sleep at night. These tips can help: Try not to take naps during the day. Keep your bedroom dark and cool. Do not exercise in the few hours before you go to bed. Do not have foods or drinks with caffeine at night. Eating and drinking Drink enough fluid to keep your pee pale yellow. Eat a healthy diet. General instructions  Talk with your provider to decide on: What things  you need help with. What your safety needs are. Ask your provider if it's safe for you to drive. If told, wear a bracelet that tracks where you are or shows that you're a person with memory loss. Work with your family to make big legal or health decisions. This may include things like advance directives, medical power of attorney, or a living will. Where to find more  information Alzheimer's Association: WesternTunes.it [employer] on Aging: BaseRingTones.pl World Health Organization: VisitDestination.com.br Contact a health care provider if: You have any new symptoms. Your symptoms get worse. You have problems with swallowing. Get help right away if: You feel very sad or feel like you may hurt yourself or others. You have thoughts about taking your own life. Your family members are worried about your safety. These symptoms may be an emergency. Take one of these steps right away: Go to your nearest emergency room. Call 911. Call the National Suicide Prevention Lifeline at 740-079-5663 or 988. Text the Crisis Text Line at 434-672-3553. This information is not intended to replace advice given to you by your health care provider. Make sure you discuss any questions you have with your health care provider. Document Revised: 04/03/2023 Document Reviewed: 08/19/2022 Elsevier Patient Education  2024 [employer].

## 2023-09-23 ENCOUNTER — Encounter: Payer: Self-pay | Admitting: Neurology

## 2023-09-26 ENCOUNTER — Telehealth: Payer: Self-pay | Admitting: Neurology

## 2023-09-26 LAB — CBC WITH DIFFERENTIAL/PLATELET
Basophils Absolute: 0 10*3/uL (ref 0.0–0.2)
Basos: 1 %
EOS (ABSOLUTE): 0.3 10*3/uL (ref 0.0–0.4)
Eos: 6 %
Hematocrit: 38.7 % (ref 34.0–46.6)
Hemoglobin: 12.6 g/dL (ref 11.1–15.9)
Immature Grans (Abs): 0 10*3/uL (ref 0.0–0.1)
Immature Granulocytes: 0 %
Lymphocytes Absolute: 1.5 10*3/uL (ref 0.7–3.1)
Lymphs: 33 %
MCH: 31.4 pg (ref 26.6–33.0)
MCHC: 32.6 g/dL (ref 31.5–35.7)
MCV: 97 fL (ref 79–97)
Monocytes Absolute: 0.5 10*3/uL (ref 0.1–0.9)
Monocytes: 11 %
Neutrophils Absolute: 2.2 10*3/uL (ref 1.4–7.0)
Neutrophils: 49 %
Platelets: 332 10*3/uL (ref 150–450)
RBC: 4.01 x10E6/uL (ref 3.77–5.28)
RDW: 14.3 % (ref 11.7–15.4)
WBC: 4.6 10*3/uL (ref 3.4–10.8)

## 2023-09-26 LAB — COMPREHENSIVE METABOLIC PANEL WITH GFR
ALT: 19 IU/L (ref 0–32)
AST: 24 IU/L (ref 0–40)
Albumin: 4.4 g/dL (ref 3.8–4.8)
Alkaline Phosphatase: 78 IU/L (ref 44–121)
BUN/Creatinine Ratio: 19 (ref 12–28)
BUN: 17 mg/dL (ref 8–27)
Bilirubin Total: 0.3 mg/dL (ref 0.0–1.2)
CO2: 24 mmol/L (ref 20–29)
Calcium: 9.7 mg/dL (ref 8.7–10.3)
Chloride: 103 mmol/L (ref 96–106)
Creatinine, Ser: 0.9 mg/dL (ref 0.57–1.00)
Globulin, Total: 2.4 g/dL (ref 1.5–4.5)
Glucose: 85 mg/dL (ref 70–99)
Potassium: 5.1 mmol/L (ref 3.5–5.2)
Sodium: 140 mmol/L (ref 134–144)
Total Protein: 6.8 g/dL (ref 6.0–8.5)
eGFR: 66 mL/min/{1.73_m2} (ref >59)

## 2023-09-26 LAB — VITAMIN B1: Thiamine: 117.3 nmol/L (ref 66.5–200.0)

## 2023-09-26 NOTE — Telephone Encounter (Signed)
 no auth required sent to [facility] [phone]

## 2023-09-30 ENCOUNTER — Other Ambulatory Visit: Payer: Self-pay | Admitting: *Deleted

## 2023-09-30 MED ORDER — ANASTROZOLE 1 MG PO TABS: 1.0000 mg | ORAL_TABLET | Freq: Every day | ORAL | 4 refills | Status: AC

## 2023-11-28 ENCOUNTER — Other Ambulatory Visit: Payer: Self-pay | Admitting: Hematology and Oncology

## 2023-11-28 DIAGNOSIS — Z853 Personal history of malignant neoplasm of breast: Secondary | ICD-10-CM

## 2023-12-10 ENCOUNTER — Ambulatory Visit
Admission: RE | Admit: 2023-12-10 | Discharge: 2023-12-10 | Disposition: A | Discharge status: Home / Self Care | Source: Ambulatory Visit | Attending: Hematology and Oncology | Admitting: Hematology and Oncology

## 2023-12-10 DIAGNOSIS — Z853 Personal history of malignant neoplasm of breast: Secondary | ICD-10-CM

## 2023-12-22 ENCOUNTER — Ambulatory Visit (HOSPITAL_COMMUNITY)
Admission: RE | Admit: 2023-12-22 | Discharge: 2023-12-22 | Disposition: A | Discharge status: Home / Self Care | Source: Ambulatory Visit | Attending: Hematology and Oncology | Admitting: Hematology and Oncology

## 2023-12-22 ENCOUNTER — Telehealth: Payer: Self-pay | Admitting: *Deleted

## 2023-12-22 ENCOUNTER — Other Ambulatory Visit: Payer: Self-pay | Admitting: *Deleted

## 2023-12-22 DIAGNOSIS — Z170 Estrogen receptor positive status [ER+]: Secondary | ICD-10-CM | POA: Diagnosis present

## 2023-12-22 DIAGNOSIS — C50412 Malignant neoplasm of upper-outer quadrant of left female breast: Secondary | ICD-10-CM | POA: Diagnosis present

## 2023-12-22 NOTE — Telephone Encounter (Signed)
 Angie from IR called to verify pt US  was viewed. Per Dr. Gudena, put in order for MRI of liver w/wo contrast and call pt. Made x3 attempts to call pt and left vmail to call office back.

## 2023-12-29 ENCOUNTER — Other Ambulatory Visit: Payer: Self-pay | Admitting: Hematology and Oncology

## 2023-12-29 ENCOUNTER — Ambulatory Visit (HOSPITAL_COMMUNITY)
Admission: RE | Admit: 2023-12-29 | Discharge: 2023-12-29 | Disposition: A | Discharge status: Home / Self Care | Source: Ambulatory Visit | Attending: Hematology and Oncology | Admitting: Hematology and Oncology

## 2023-12-29 DIAGNOSIS — Z170 Estrogen receptor positive status [ER+]: Secondary | ICD-10-CM | POA: Insufficient documentation

## 2023-12-29 DIAGNOSIS — C50412 Malignant neoplasm of upper-outer quadrant of left female breast: Secondary | ICD-10-CM | POA: Insufficient documentation

## 2023-12-29 MED ORDER — GADOBUTROL 1 MMOL/ML IV SOLN
6.0000 mL | Freq: Once | INTRAVENOUS | Status: AC | PRN
  Administered 2023-12-29: 6 mL via INTRAVENOUS

## 2023-12-30 ENCOUNTER — Inpatient Hospital Stay: Payer: Medicare Other | Attending: Hematology and Oncology | Admitting: Hematology and Oncology

## 2023-12-30 DIAGNOSIS — Z923 Personal history of irradiation: Secondary | ICD-10-CM | POA: Insufficient documentation

## 2023-12-30 DIAGNOSIS — Z170 Estrogen receptor positive status [ER+]: Secondary | ICD-10-CM | POA: Insufficient documentation

## 2023-12-30 DIAGNOSIS — K862 Cyst of pancreas: Secondary | ICD-10-CM | POA: Insufficient documentation

## 2023-12-30 DIAGNOSIS — Z79811 Long term (current) use of aromatase inhibitors: Secondary | ICD-10-CM | POA: Insufficient documentation

## 2023-12-30 DIAGNOSIS — C50412 Malignant neoplasm of upper-outer quadrant of left female breast: Secondary | ICD-10-CM | POA: Insufficient documentation

## 2023-12-30 DIAGNOSIS — Z1732 Human epidermal growth factor receptor 2 negative status: Secondary | ICD-10-CM | POA: Insufficient documentation

## 2023-12-30 DIAGNOSIS — Z1722 Progesterone receptor negative status: Secondary | ICD-10-CM | POA: Insufficient documentation

## 2023-12-30 DIAGNOSIS — Z9221 Personal history of antineoplastic chemotherapy: Secondary | ICD-10-CM | POA: Insufficient documentation

## 2023-12-30 NOTE — Assessment & Plan Note (Deleted)
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)    (3) left breast upper outer quadrant biopsy 09/04/2020 shows 3 foci of invasive ductal carcinoma, grade 1,  largest 0.15 cm, estrogen receptor strongly positive, progesterone receptor and HER2 negative, with an MIB-1 of 2% = pT1a cN0, stage IA   (4)  lumpectomy with no sentinel lymph node sampling 09/19/2020 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 Breast Exam: 12/30/2023: Benign 2. Mammogram 12/10/2023: Benign, breast density category B     BAP1 mutation: She will need an annual renal ultrasound to evaluate for renal cell cancer. 12/22/2023 abdominal ultrasound: 6 mm rounded area of posterior wall of the CBD, MRI recommended  We will plan abdominal ultrasound and will look at the liver and the kidneys annually   She follows with her ophthalmologist to rule out uveal melanoma.  She had laser surgery and has really good vision.   Both of her daughters were diagnosed with breast cancer and they have had mastectomies.  They did not detect any gene mutations.   RTC in 1 year

## 2023-12-31 ENCOUNTER — Inpatient Hospital Stay: Admitting: Hematology and Oncology

## 2023-12-31 VITALS — BP 114/70 | HR 80 | Temp 98.7°F | Resp 20 | Ht 64.5 in | Wt 153.4 lb

## 2023-12-31 DIAGNOSIS — Z1732 Human epidermal growth factor receptor 2 negative status: Secondary | ICD-10-CM | POA: Diagnosis not present

## 2023-12-31 DIAGNOSIS — Z170 Estrogen receptor positive status [ER+]: Secondary | ICD-10-CM

## 2023-12-31 DIAGNOSIS — C50412 Malignant neoplasm of upper-outer quadrant of left female breast: Secondary | ICD-10-CM | POA: Diagnosis present

## 2023-12-31 DIAGNOSIS — Z9221 Personal history of antineoplastic chemotherapy: Secondary | ICD-10-CM | POA: Diagnosis not present

## 2023-12-31 DIAGNOSIS — Z923 Personal history of irradiation: Secondary | ICD-10-CM | POA: Diagnosis not present

## 2023-12-31 DIAGNOSIS — Z79811 Long term (current) use of aromatase inhibitors: Secondary | ICD-10-CM | POA: Diagnosis not present

## 2023-12-31 DIAGNOSIS — K862 Cyst of pancreas: Secondary | ICD-10-CM

## 2023-12-31 DIAGNOSIS — Z1722 Progesterone receptor negative status: Secondary | ICD-10-CM | POA: Diagnosis not present

## 2023-12-31 NOTE — Progress Notes (Signed)
 Patient Care Team: Burney Darice CROME, MD as PCP - General (Family Medicine) Glean Stephane BROCKS, RN (Inactive) as Oncology Nurse Navigator Tyree Nanetta SAILOR, RN as Oncology Nurse Navigator Vanderbilt Ned, MD as Consulting Physician (General Surgery) Magrinat, Sandria BROCKS, MD (Inactive) as Consulting Physician (Oncology) Izell Domino, MD as Attending Physician (Radiation Oncology) Ines Onetha NOVAK, MD as Consulting Physician (Neurology) Debarah Lorrene DEL., MD (Ophthalmology) Tommas Pears, MD as Referring Physician (Endocrinology) Jewell, Jolene R, MD as Consulting Physician (Dermatology)  DIAGNOSIS:  Encounter Diagnoses  Name Primary?   Malignant neoplasm of upper-outer quadrant of left breast in female, estrogen receptor positive (HCC)    Pancreatic cyst Yes    SUMMARY OF ONCOLOGIC HISTORY: Oncology History  Malignant neoplasm of upper-outer quadrant of left breast in female, estrogen receptor positive (HCC)  09/11/2020 Initial Diagnosis   Malignant neoplasm of upper-outer quadrant of left breast in female, estrogen receptor positive (HCC)   09/13/2020 Cancer Staging   Staging form: Breast, AJCC 8th Edition - Clinical: Stage IA (cT1a, cN0, cM0, G1, ER+, PR-, HER2-) - Signed by Layla Sandria BROCKS, MD on 09/13/2020 Stage prefix: Initial diagnosis Histologic grading system: 3 grade system     CHIEF COMPLIANT: Follow-up to review the results of MRI of the liver   HISTORY OF PRESENT ILLNESS:  History of Present Illness Sandra Galloway is a 78 year old female who presents for follow-up after a recent scan revealed a pancreatic cyst.  Imaging shows a two-centimeter cyst on the tail of the pancreas, communicating with the pancreatic duct.  Her family history includes cancer in her sister and two daughters, and she has had cancer herself.     ALLERGIES:  is allergic to amoxicillin and amoxicillin.  MEDICATIONS:  Current Outpatient Medications  Medication Sig Dispense  Refill   anastrozole  (ARIMIDEX ) 1 MG tablet Take 1 tablet (1 mg total) by mouth daily. 90 tablet 4   CALCIUM-VITAMIN D PO Take 1 tablet by mouth 2 (two) times daily.     cyanocobalamin (VITAMIN B12) 1000 MCG/ML injection INJECT 1 ML (1000 MCG) INTRAMUSCULARLY WEEKLY     donepezil (ARICEPT) 10 MG tablet Take 10 mg by mouth at bedtime.     Ferrous Sulfate (IRON PO) Take 1 tablet by mouth daily.     levothyroxine (SYNTHROID) 75 MCG tablet synthroid  75 mcg tabs     LOSARTAN POTASSIUM-HCTZ PO Take 1.5 tablets by mouth daily.     losartan-hydrochlorothiazide (HYZAAR) 50-12.5 MG tablet Take 1 tablet by mouth daily.     memantine (NAMENDA) 10 MG tablet Take 10 mg by mouth 2 (two) times daily.     montelukast (SINGULAIR) 10 MG tablet Take 10 mg by mouth at bedtime.     olmesartan-hydrochlorothiazide (BENICAR HCT) 20-12.5 MG tablet Take 1 tablet by mouth daily.     pravastatin (PRAVACHOL) 80 MG tablet Take 80 mg by mouth daily.     predniSONE (DELTASONE) 10 MG tablet Take by mouth.     rosuvastatin (CRESTOR) 20 MG tablet Take 20 mg by mouth at bedtime.     Sod Citrate-Citric Acid (CITRIC ACID-SODIUM CITRATE PO) Take by mouth daily as needed.     Turmeric 400 MG CAPS Take 1 tablet by mouth daily.     No current facility-administered medications for this visit.    PHYSICAL EXAMINATION: ECOG PERFORMANCE STATUS: 1 - Symptomatic but completely ambulatory  Vitals:   12/31/23 1300  BP: 114/70  Pulse: 80  Resp: 20  Temp: 98.7 F (37.1  C)  SpO2: 97%   Filed Weights   12/31/23 1300  Weight: 153 lb 6.4 oz (69.6 kg)     LABORATORY DATA:  I have reviewed the data as listed    Latest Ref Rng & Units 09/22/2023   11:29 AM 12/25/2021    8:39 AM 12/26/2020    8:23 AM  CMP  Glucose 70 - 99 mg/dL 85  87  85   BUN 8 - 27 mg/dL 17  12  12    Creatinine 0.57 - 1.00 mg/dL 9.09  9.19  9.17   Sodium 134 - 144 mmol/L 140  135  140   Potassium 3.5 - 5.2 mmol/L 5.1  4.2  3.9   Chloride 96 - 106 mmol/L 103   102  103   CO2 20 - 29 mmol/L 24  29  28    Calcium 8.7 - 10.3 mg/dL 9.7  9.2  9.7   Total Protein 6.0 - 8.5 g/dL 6.8  6.8  7.3   Total Bilirubin 0.0 - 1.2 mg/dL 0.3  0.4  0.4   Alkaline Phos 44 - 121 IU/L 78  55  69   AST 0 - 40 IU/L 24  17  20    ALT 0 - 32 IU/L 19  14  12      Lab Results  Component Value Date   WBC 4.6 09/22/2023   HGB 12.6 09/22/2023   HCT 38.7 09/22/2023   MCV 97 09/22/2023   PLT 332 09/22/2023   NEUTROABS 2.2 09/22/2023    ASSESSMENT & PLAN:  Malignant neoplasm of upper-outer quadrant of left breast in female, estrogen receptor positive (HCC) (1) status post left lumpectomy and axillary lymph node dissection in 1996 for a [place] N1 invasive breast cancer, status post adjuvant chemotherapy  [most likely cyclophosphamide and doxorubicin every 3 weeks x 6], followed by adjuvant radiation, then tamoxifen for 5 years   (2) genetics testing 09/11/2020 through the [facility]-Expanded +RNAinsight Panel 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)    (3) left breast upper outer quadrant biopsy 09/04/2020 shows 3 foci of invasive ductal carcinoma, grade 1,  largest 0.15 cm, estrogen receptor strongly positive, progesterone receptor and HER2 negative, with an MIB-1 of 2% = pT1a cN0, stage IA   (4)  lumpectomy with no sentinel lymph node sampling 09/19/2020 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 Breast Exam: 12/30/2022: Benign 2. Mammogram 12/06/2022: Benign, breast density category B 3.  12/30/2023: Liver MRI: CBD shadowing: Felt to be artifact, pancreatic tail cyst 2 cm (recommend 79-month follow-up liver MRI)   BAP1 mutation: She will need  an annual renal ultrasound to evaluate for renal cell cancer. 12/12/2022 renal ultrasound: Benign 12/22/2023: Abdominal ultrasound: No renal mass, 6 mm hypoechoic posterior common bile duct wall nodule    She follows with her ophthalmologist to rule out uveal melanoma.  She had laser surgery and has really good vision.   Both of her daughters were diagnosed with breast cancer and they have had mastectomies.  They did not detect any gene mutations. 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 Etiology uncertain; benign or related to past pancreatitis or scar tissue. Biopsy not indicated. -  Schedule MRI in 6 months to monitor cyst.      Orders Placed This Encounter  Procedures   MR Abdomen W Wo Contrast    Standing Status:   Future    Expected Date:   07/02/2024    Expiration Date:   12/30/2024    If indicated for the ordered procedure, I authorize the administration of contrast media per Radiology protocol:   Yes    What is the patient's sedation requirement?:   No Sedation    Does the patient have a pacemaker or implanted devices?:   No    Preferred imaging location?:   [facility] (table limit-550lbs)    Release to patient:   Immediate   The patient has a good understanding of the overall plan. she agrees with it. she will call with any problems that may develop before the next visit here. Total time spent: 30 mins including face to face time and time spent for planning, charting and co-ordination of care   Viinay K Jenniger Figiel, MD 12/31/23

## 2023-12-31 NOTE — Assessment & Plan Note (Signed)
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)    (3) left breast upper outer quadrant biopsy 09/04/2020 shows 3 foci of invasive ductal carcinoma, grade 1,  largest 0.15 cm, estrogen receptor strongly positive, progesterone receptor and HER2 negative, with an MIB-1 of 2% = pT1a cN0, stage IA   (4)  lumpectomy with no sentinel lymph node sampling 09/19/2020 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 Breast Exam: 12/30/2022: Benign 2. Mammogram 12/06/2022: Benign, breast density category B    BAP1 mutation: She will need an annual renal ultrasound to evaluate for renal cell cancer. 12/12/2022 renal ultrasound: Benign We will plan abdominal ultrasound and will look at the liver and the kidneys next year.  She follows with her ophthalmologist to rule out uveal melanoma.  She had laser surgery and has really good vision.  Both of her daughters were diagnosed with breast cancer and they have had mastectomies.  They did not detect any gene mutations.   RTC in 1 year

## 2024-02-23 ENCOUNTER — Telehealth: Payer: Self-pay | Admitting: Neurology

## 2024-02-23 NOTE — Telephone Encounter (Signed)
 Pt called  to cancel appt due to seeing different  Neurologist   Appt canceled

## 2024-02-25 ENCOUNTER — Ambulatory Visit: Admitting: Neurology

## 2024-05-15 ENCOUNTER — Encounter: Payer: Self-pay | Admitting: Gastroenterology

## 2024-07-08 ENCOUNTER — Ambulatory Visit: Admitting: Hematology and Oncology

## 2024-07-31 ENCOUNTER — Other Ambulatory Visit

## 2024-08-05 ENCOUNTER — Inpatient Hospital Stay: Admitting: Hematology and Oncology
